# Patient Record
Sex: Female | Born: 1960
Health system: Southern US, Community
[De-identification: ages and names within clinical notes are randomized; demographics above are authoritative.]

## PROBLEM LIST (undated history)

## (undated) ENCOUNTER — Ambulatory Visit

## (undated) DIAGNOSIS — F439 Reaction to severe stress, unspecified: Secondary | ICD-10-CM

## (undated) DIAGNOSIS — R300 Dysuria: Secondary | ICD-10-CM

## (undated) DIAGNOSIS — R928 Other abnormal and inconclusive findings on diagnostic imaging of breast: Secondary | ICD-10-CM

## (undated) DIAGNOSIS — N390 Urinary tract infection, site not specified: Secondary | ICD-10-CM

## (undated) DIAGNOSIS — K5900 Constipation, unspecified: Secondary | ICD-10-CM

## (undated) DIAGNOSIS — D649 Anemia, unspecified: Secondary | ICD-10-CM

## (undated) HISTORY — DX: Dysuria: R30.0

## (undated) HISTORY — DX: Urinary tract infection, site not specified: N39.0

## (undated) HISTORY — DX: Constipation, unspecified: K59.00

## (undated) HISTORY — DX: Reaction to severe stress, unspecified: F43.9

## (undated) HISTORY — DX: Anemia, unspecified: D64.9

## (undated) HISTORY — PX: NO PAST SURGERIES: SHX2092

---
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
## 2002-02-03 ENCOUNTER — Ambulatory Visit (HOSPITAL_COMMUNITY): Admission: RE | Admit: 2002-02-03 | Discharge: 2002-02-03 | Payer: Self-pay | Admitting: Family Medicine

## 2002-02-03 ENCOUNTER — Encounter: Payer: Self-pay | Admitting: Family Medicine

## 2003-06-13 ENCOUNTER — Ambulatory Visit (HOSPITAL_COMMUNITY): Admission: RE | Admit: 2003-06-13 | Discharge: 2003-06-13 | Payer: Self-pay | Admitting: *Deleted

## 2004-06-14 ENCOUNTER — Ambulatory Visit (HOSPITAL_COMMUNITY): Admission: RE | Admit: 2004-06-14 | Discharge: 2004-06-14 | Payer: Self-pay | Admitting: *Deleted

## 2005-06-16 ENCOUNTER — Ambulatory Visit (HOSPITAL_COMMUNITY): Admission: RE | Admit: 2005-06-16 | Discharge: 2005-06-16 | Payer: Self-pay | Admitting: *Deleted

## 2006-06-19 ENCOUNTER — Ambulatory Visit (HOSPITAL_COMMUNITY): Admission: RE | Admit: 2006-06-19 | Discharge: 2006-06-19 | Payer: Self-pay | Admitting: Obstetrics and Gynecology

## 2007-03-01 ENCOUNTER — Other Ambulatory Visit: Admission: RE | Admit: 2007-03-01 | Discharge: 2007-03-01 | Payer: Self-pay | Admitting: Obstetrics and Gynecology

## 2007-06-22 ENCOUNTER — Ambulatory Visit (HOSPITAL_COMMUNITY): Admission: RE | Admit: 2007-06-22 | Discharge: 2007-06-22 | Payer: Self-pay | Admitting: Obstetrics and Gynecology

## 2008-03-03 ENCOUNTER — Other Ambulatory Visit: Admission: RE | Admit: 2008-03-03 | Discharge: 2008-03-03 | Payer: Self-pay | Admitting: Obstetrics and Gynecology

## 2008-06-27 ENCOUNTER — Ambulatory Visit (HOSPITAL_COMMUNITY): Admission: RE | Admit: 2008-06-27 | Discharge: 2008-06-27 | Payer: Self-pay | Admitting: Obstetrics & Gynecology

## 2009-03-27 ENCOUNTER — Other Ambulatory Visit: Admission: RE | Admit: 2009-03-27 | Discharge: 2009-03-27 | Payer: Self-pay | Admitting: Obstetrics and Gynecology

## 2009-06-28 ENCOUNTER — Ambulatory Visit (HOSPITAL_COMMUNITY): Admission: RE | Admit: 2009-06-28 | Discharge: 2009-06-28 | Payer: Self-pay | Admitting: Obstetrics & Gynecology

## 2010-05-12 ENCOUNTER — Encounter: Payer: Self-pay | Admitting: Obstetrics and Gynecology

## 2010-05-24 ENCOUNTER — Other Ambulatory Visit: Payer: Self-pay | Admitting: Obstetrics & Gynecology

## 2010-05-24 DIAGNOSIS — Z139 Encounter for screening, unspecified: Secondary | ICD-10-CM

## 2010-07-01 ENCOUNTER — Ambulatory Visit (HOSPITAL_COMMUNITY)
Admission: RE | Admit: 2010-07-01 | Discharge: 2010-07-01 | Disposition: A | Discharge status: Home / Self Care | Payer: BC Managed Care – PPO | Source: Ambulatory Visit | Attending: Obstetrics & Gynecology | Admitting: Obstetrics & Gynecology

## 2010-07-01 DIAGNOSIS — Z139 Encounter for screening, unspecified: Secondary | ICD-10-CM

## 2010-07-01 DIAGNOSIS — Z1231 Encounter for screening mammogram for malignant neoplasm of breast: Secondary | ICD-10-CM | POA: Insufficient documentation

## 2011-03-05 ENCOUNTER — Encounter: Payer: Self-pay | Admitting: Internal Medicine

## 2011-03-18 ENCOUNTER — Ambulatory Visit (AMBULATORY_SURGERY_CENTER): Payer: BC Managed Care – PPO

## 2011-03-18 ENCOUNTER — Encounter: Payer: Self-pay | Admitting: Internal Medicine

## 2011-03-18 VITALS — Ht 64.0 in | Wt 173.6 lb

## 2011-03-18 DIAGNOSIS — Z1211 Encounter for screening for malignant neoplasm of colon: Secondary | ICD-10-CM

## 2011-03-18 MED ORDER — PEG-KCL-NACL-NASULF-NA ASC-C 100 G PO SOLR: 1.0000 | Freq: Once | ORAL | Status: AC

## 2011-04-01 ENCOUNTER — Ambulatory Visit (AMBULATORY_SURGERY_CENTER): Payer: BC Managed Care – PPO | Admitting: Internal Medicine

## 2011-04-01 ENCOUNTER — Encounter: Payer: Self-pay | Admitting: Internal Medicine

## 2011-04-01 VITALS — BP 151/96 | HR 101 | Temp 97.8°F | Resp 20 | Ht 64.0 in | Wt 173.0 lb

## 2011-04-01 DIAGNOSIS — D126 Benign neoplasm of colon, unspecified: Secondary | ICD-10-CM

## 2011-04-01 DIAGNOSIS — Z1211 Encounter for screening for malignant neoplasm of colon: Secondary | ICD-10-CM

## 2011-04-01 HISTORY — PX: COLONOSCOPY: SHX174

## 2011-04-01 MED ORDER — SODIUM CHLORIDE 0.9 % IV SOLN: 500.0000 mL | INTRAVENOUS | Status: DC

## 2011-04-01 NOTE — Progress Notes (Signed)
Patient did not experience any of the following events: a burn prior to discharge; a fall within the facility; wrong site/side/patient/procedure/implant event; or a hospital transfer or hospital admission upon discharge from the facility. (G8907) Patient did not have preoperative order for IV antibiotic SSI prophylaxis. ([id number])  

## 2011-04-01 NOTE — Patient Instructions (Signed)
Please refer to your blue and neon green sheets for instructions regarding diet and activity for the rest of today.  You may resume your medications as you would normally take them.   Hemorrhoids Hemorrhoids are enlarged (dilated) veins around the rectum. There are 2 types of hemorrhoids, and the type of hemorrhoid is determined by its location. Internal hemorrhoids occur in the veins just inside the rectum.They are usually not painful, but they may bleed.However, they may poke through to the outside and become irritated and painful. External hemorrhoids involve the veins outside the anus and can be felt as a painful swelling or hard lump near the anus.They are often itchy and may crack and bleed. Sometimes clots will form in the veins. This makes them swollen and painful. These are called thrombosed hemorrhoids. CAUSES Causes of hemorrhoids include:  Pregnancy. This increases the pressure in the hemorrhoidal veins.   Constipation.   Straining to have a bowel movement.   Obesity.   Heavy lifting or other activity that caused you to strain.  TREATMENT Most of the time hemorrhoids improve in 1 to 2 weeks. However, if symptoms do not seem to be getting better or if you have a lot of rectal bleeding, your caregiver may perform a procedure to help make the hemorrhoids get smaller or remove them completely.Possible treatments include:  Rubber band ligation. A rubber band is placed at the base of the hemorrhoid to cut off the circulation.   Sclerotherapy. A chemical is injected to shrink the hemorrhoid.   Infrared light therapy. Tools are used to burn the hemorrhoid.   Hemorrhoidectomy. This is surgical removal of the hemorrhoid.  HOME CARE INSTRUCTIONS   Increase fiber in your diet. Ask your caregiver about using fiber supplements.   Drink enough water and fluids to keep your urine clear or pale yellow.   Exercise regularly.   Go to the bathroom when you have the urge to have a  bowel movement. Do not wait.   Avoid straining to have bowel movements.   Keep the anal area dry and clean.   Only take over-the-counter or prescription medicines for pain, discomfort, or fever as directed by your caregiver.  If your hemorrhoids are thrombosed:  Take warm sitz baths for 20 to 30 minutes, 3 to 4 times per day.   If the hemorrhoids are very tender and swollen, place ice packs on the area as tolerated. Using ice packs between sitz baths may be helpful. Fill a plastic bag with ice. Place a towel between the bag of ice and your skin.   Medicated creams and suppositories may be used or applied as directed.   Do not use a donut-shaped pillow or sit on the toilet for long periods. This increases blood pooling and pain.  SEEK MEDICAL CARE IF:   You have increasing pain and swelling that is not controlled with your medicine.   You have uncontrolled bleeding.   You have difficulty or you are unable to have a bowel movement.   You have pain or inflammation outside the area of the hemorrhoids.   You have chills or an oral temperature above 102 F (38.9 C).  MAKE SURE YOU:   Understand these instructions.   Will watch your condition.   Will get help right away if you are not doing well or get worse.  Document Released: 04/04/2000 Document Revised: 12/18/2010 Document Reviewed: 08/10/2007 [facility] Patient Information 2012 [place], [place].  Diverticulosis Diverticulosis is a common condition that develops when small  pouches (diverticula) form in the wall of the colon. The risk of diverticulosis increases with age. It happens more often in people who eat a low-fiber diet. Most individuals with diverticulosis have no symptoms. Those individuals with symptoms usually experience abdominal pain, constipation, or loose stools (diarrhea). HOME CARE INSTRUCTIONS   Increase the amount of fiber in your diet as directed by your caregiver or dietician. This may reduce symptoms of  diverticulosis.   Your caregiver may recommend taking a dietary fiber supplement.   Drink at least 6 to 8 glasses of water each day to prevent constipation.   Try not to strain when you have a bowel movement.   Your caregiver may recommend avoiding nuts and seeds to prevent complications, although this is still an uncertain benefit.   Only take over-the-counter or prescription medicines for pain, discomfort, or fever as directed by your caregiver.  FOODS WITH HIGH FIBER CONTENT INCLUDE:  Fruits. Apple, peach, pear, tangerine, raisins, prunes.   Vegetables. Brussels sprouts, asparagus, broccoli, cabbage, carrot, cauliflower, romaine lettuce, spinach, summer squash, tomato, winter squash, zucchini.   Starchy Vegetables. Baked beans, kidney beans, lima beans, split peas, lentils, potatoes (with skin).   Grains. Whole wheat bread, brown rice, bran flake cereal, plain oatmeal, white rice, shredded wheat, bran muffins.  SEEK IMMEDIATE MEDICAL CARE IF:   You develop increasing pain or severe bloating.   You have an oral temperature above 102 F (38.9 C), not controlled by medicine.   You develop vomiting or bowel movements that are bloody or black.  Document Released: 01/03/2004 Document Revised: 12/18/2010 Document Reviewed: 09/05/2009 [facility] Patient Information 2012 [place], [place].

## 2011-04-01 NOTE — Op Note (Signed)
[facility] [address] [place], [place]  [address]  COLONOSCOPY PROCEDURE REPORT  PATIENT:  Sarah, May  MR#:  [id number] BIRTHDATE:  1961-01-28, 50 yrs. old  GENDER:  female ENDOSCOPIST:  Hedwig Morton. Juanda Chance, MD REF. BY:  Artis Delay, M.D. PROCEDURE DATE:  04/01/2011 PROCEDURE:  Colonoscopy with snare polypectomy ASA CLASS:  Class II INDICATIONS:  colorectal cancer screening, average risk MEDICATIONS:   These medications were titrated to patient response per physician's verbal order, Versed 10 mg, Fentanyl 100 mcg, Benadryl 25 mg  DESCRIPTION OF PROCEDURE:   After the risks and benefits and of the procedure were explained, informed consent was obtained. Digital rectal exam was performed and revealed no rectal masses. The LB [id number] endoscope was introduced through the anus and advanced to the cecum, which was identified by both the appendix and ileocecal valve.  The quality of the prep was good, using MoviPrep.  The instrument was then slowly withdrawn as the colon was fully examined. <<PROCEDUREIMAGES>>  FINDINGS:  Moderate diverticulosis was found in the sigmoid colon (see image1). narrow lumen, deep diverticuli  A sessile polyp was found. at 20 cm polyp vs musocal tear from pushing the scope through narrow sigmoid colon, Polyp was snared without cautery. Retrieval was unsuccessful (see image5). snare polyp  This was otherwise a normal examination of the colon (see image1, image2, image3, and image4).  Internal Hemorrhoids were found (see image7 and image8).   Retroflexed views in the rectum revealed no abnormalities.    The scope was then withdrawn from the patient and the procedure completed.  COMPLICATIONS:  None ENDOSCOPIC IMPRESSION: 1) Moderate diverticulosis in the sigmoid colon 2) Sessile polyp 3) Otherwise normal examination 4) Internal hemorrhoids RECOMMENDATIONS: 1) High fiber diet.  REPEAT EXAM:  In 10 year(s) for.  metamucil 1 Tsp  daily  ______________________________ Hedwig Morton. Juanda Chance, MD  CC:  n. eSIGNED:   Hedwig Morton. Tahira Olivarez at 04/01/2011 02:42 PM  Debbra Riding, [phone]

## 2011-04-02 ENCOUNTER — Telehealth: Payer: Self-pay | Admitting: *Deleted

## 2011-04-02 NOTE — Telephone Encounter (Signed)
No answer, message left

## 2011-04-04 ENCOUNTER — Other Ambulatory Visit: Payer: Self-pay | Admitting: Obstetrics and Gynecology

## 2011-04-04 ENCOUNTER — Other Ambulatory Visit (HOSPITAL_COMMUNITY)
Admission: RE | Admit: 2011-04-04 | Discharge: 2011-04-04 | Disposition: A | Discharge status: Home / Self Care | Payer: BC Managed Care – PPO | Source: Ambulatory Visit | Attending: Obstetrics and Gynecology | Admitting: Obstetrics and Gynecology

## 2011-04-04 DIAGNOSIS — Z01419 Encounter for gynecological examination (general) (routine) without abnormal findings: Secondary | ICD-10-CM | POA: Insufficient documentation

## 2011-05-06 ENCOUNTER — Other Ambulatory Visit (HOSPITAL_COMMUNITY): Payer: Self-pay | Admitting: Family Medicine

## 2011-05-06 DIAGNOSIS — Z139 Encounter for screening, unspecified: Secondary | ICD-10-CM

## 2011-07-04 ENCOUNTER — Ambulatory Visit (HOSPITAL_COMMUNITY)
Admission: RE | Admit: 2011-07-04 | Discharge: 2011-07-04 | Disposition: A | Discharge status: Home / Self Care | Payer: BC Managed Care – PPO | Source: Ambulatory Visit | Attending: Family Medicine | Admitting: Family Medicine

## 2011-07-04 DIAGNOSIS — Z1231 Encounter for screening mammogram for malignant neoplasm of breast: Secondary | ICD-10-CM | POA: Insufficient documentation

## 2011-07-04 DIAGNOSIS — Z139 Encounter for screening, unspecified: Secondary | ICD-10-CM

## 2012-05-13 ENCOUNTER — Other Ambulatory Visit (HOSPITAL_COMMUNITY)
Admission: RE | Admit: 2012-05-13 | Discharge: 2012-05-13 | Disposition: A | Discharge status: Home / Self Care | Payer: BC Managed Care – PPO | Source: Ambulatory Visit | Attending: Obstetrics and Gynecology | Admitting: Obstetrics and Gynecology

## 2012-05-13 ENCOUNTER — Other Ambulatory Visit: Payer: Self-pay | Admitting: Adult Health

## 2012-05-13 DIAGNOSIS — Z1151 Encounter for screening for human papillomavirus (HPV): Secondary | ICD-10-CM | POA: Insufficient documentation

## 2012-05-13 DIAGNOSIS — Z01419 Encounter for gynecological examination (general) (routine) without abnormal findings: Secondary | ICD-10-CM | POA: Insufficient documentation

## 2012-06-11 ENCOUNTER — Other Ambulatory Visit (HOSPITAL_COMMUNITY): Payer: Self-pay | Admitting: Family Medicine

## 2012-06-11 DIAGNOSIS — Z139 Encounter for screening, unspecified: Secondary | ICD-10-CM

## 2012-07-05 ENCOUNTER — Ambulatory Visit (HOSPITAL_COMMUNITY)
Admission: RE | Admit: 2012-07-05 | Discharge: 2012-07-05 | Disposition: A | Discharge status: Home / Self Care | Payer: BC Managed Care – PPO | Source: Ambulatory Visit | Attending: Family Medicine | Admitting: Family Medicine

## 2012-07-05 DIAGNOSIS — Z1231 Encounter for screening mammogram for malignant neoplasm of breast: Secondary | ICD-10-CM | POA: Insufficient documentation

## 2012-07-05 DIAGNOSIS — Z139 Encounter for screening, unspecified: Secondary | ICD-10-CM

## 2013-06-06 ENCOUNTER — Other Ambulatory Visit (HOSPITAL_COMMUNITY): Payer: Self-pay | Admitting: Family Medicine

## 2013-06-06 DIAGNOSIS — Z1231 Encounter for screening mammogram for malignant neoplasm of breast: Secondary | ICD-10-CM

## 2013-07-15 ENCOUNTER — Ambulatory Visit (HOSPITAL_COMMUNITY)
Admission: RE | Admit: 2013-07-15 | Discharge: 2013-07-15 | Disposition: A | Discharge status: Home / Self Care | Payer: BC Managed Care – PPO | Source: Ambulatory Visit | Attending: Family Medicine | Admitting: Family Medicine

## 2013-07-15 DIAGNOSIS — Z1231 Encounter for screening mammogram for malignant neoplasm of breast: Secondary | ICD-10-CM | POA: Insufficient documentation

## 2014-05-08 ENCOUNTER — Ambulatory Visit (INDEPENDENT_AMBULATORY_CARE_PROVIDER_SITE_OTHER): Payer: BLUE CROSS/BLUE SHIELD | Admitting: Adult Health

## 2014-05-08 ENCOUNTER — Encounter: Payer: Self-pay | Admitting: Adult Health

## 2014-05-08 VITALS — BP 130/68 | HR 74 | Ht 63.25 in | Wt 158.0 lb

## 2014-05-08 DIAGNOSIS — F439 Reaction to severe stress, unspecified: Secondary | ICD-10-CM

## 2014-05-08 DIAGNOSIS — K5900 Constipation, unspecified: Secondary | ICD-10-CM

## 2014-05-08 DIAGNOSIS — Z1212 Encounter for screening for malignant neoplasm of rectum: Secondary | ICD-10-CM

## 2014-05-08 DIAGNOSIS — Z01419 Encounter for gynecological examination (general) (routine) without abnormal findings: Secondary | ICD-10-CM

## 2014-05-08 HISTORY — DX: Constipation, unspecified: K59.00

## 2014-05-08 HISTORY — DX: Reaction to severe stress, unspecified: F43.9

## 2014-05-08 LAB — HEMOCCULT GUIAC POC 1CARD (OFFICE): Fecal Occult Blood, POC: NEGATIVE

## 2014-05-08 MED ORDER — SERTRALINE HCL 25 MG PO TABS: 25.0000 mg | ORAL_TABLET | Freq: Every day | ORAL | Status: DC

## 2014-05-08 NOTE — Progress Notes (Signed)
Patient ID: Sarah May, female   DOB: 04-Jan-1961, 54 y.o.   MRN: [id number] History of Present Illness: Sarah May is a 54 year old white female in for gyn exam.She had a normal pap with negative HPV 05/13/12.She is complaining of constipation, has used exlax, and is stressed at home and work.Her Mom has dementia and brother as Down's.Had colonoscopy 03/2011.   Current Medications, Allergies, Past Medical History, Past Surgical History, Family History and Social History were reviewed in [employer] record.     Review of Systems: Patient denies any headaches, blurred vision, shortness of breath, chest pain, abdominal pain, problems with  urination, or intercourse. Not having sex, no joint pain or mood swings,see HPI for positives.    Physical Exam:BP 130/68 mmHg  Pulse 74  Ht 5' 3.25" (1.607 m)  Wt 158 lb (71.668 kg)  BMI 27.75 kg/m2  LMP 06/10/2011 General:  Well developed, well nourished, no acute distress Skin:  Warm and dry Neck:  Midline trachea, normal thyroid Lungs; Clear to auscultation bilaterally Breast:  No dominant palpable mass, retraction, or nipple discharge Cardiovascular: Regular rate and rhythm Abdomen:  Soft, non tender, no hepatosplenomegaly Pelvic:  External genitalia is normal in appearance, no lesions.  The vagina has loss of color, moisture and rugae.  The cervix is smooth.  Uterus is felt to be normal size, shape, and contour.  No                adnexal masses or tenderness noted. Rectal: Good sphincter tone, no polyps,internal hemorrhoids felt.  Hemoccult negative. Extremities:  No swelling or varicosities noted Psych:  No mood changes,alert and cooperative,seems tired, discussed taking time for self and trying Zoloft, declines counseling at this time   Impression: Well woman gyn exam  Constipation  Stress     Plan: Try miralax Rx Zoloft 25 mg #30 1 daily with 3 refills Follow up in 3 months Pap and physical in 1 year Mammogram  yearly Labs at work Review handouts on stress and constipation

## 2014-05-08 NOTE — Patient Instructions (Signed)
Constipation  Constipation is when a person has fewer than three bowel movements a week, has difficulty having a bowel movement, or has stools that are dry, hard, or larger than normal. As people grow older, constipation is more common. If you try to fix constipation with medicines that make you have a bowel movement (laxatives), the problem may get worse. Long-term laxative use may cause the muscles of the colon to become weak. A low-fiber diet, not taking in enough fluids, and taking certain medicines may make constipation worse.   CAUSES   · Certain medicines, such as antidepressants, pain medicine, iron supplements, antacids, and water pills.    · Certain diseases, such as diabetes, irritable bowel syndrome (IBS), thyroid disease, or depression.    · Not drinking enough water.    · Not eating enough fiber-rich foods.    · Stress or travel.    · Lack of physical activity or exercise.    · Ignoring the urge to have a bowel movement.    · Using laxatives too much.    SIGNS AND SYMPTOMS   · Having fewer than three bowel movements a week.    · Straining to have a bowel movement.    · Having stools that are hard, dry, or larger than normal.    · Feeling full or bloated.    · Pain in the lower abdomen.    · Not feeling relief after having a bowel movement.    DIAGNOSIS   Your health care provider will take a medical history and perform a physical exam. Further testing may be done for severe constipation. Some tests may include:  · A barium enema X-ray to examine your rectum, colon, and, sometimes, your small intestine.    · A sigmoidoscopy to examine your lower colon.    · A colonoscopy to examine your entire colon.  TREATMENT   Treatment will depend on the severity of your constipation and what is causing it. Some dietary treatments include drinking more fluids and eating more fiber-rich foods. Lifestyle treatments may include regular exercise. If these diet and lifestyle recommendations do not help, your health care  provider may recommend taking over-the-counter laxative medicines to help you have bowel movements. Prescription medicines may be prescribed if over-the-counter medicines do not work.   HOME CARE INSTRUCTIONS   · Eat foods that have a lot of fiber, such as fruits, vegetables, whole grains, and beans.  · Limit foods high in fat and processed sugars, such as french fries, hamburgers, cookies, candies, and soda.    · A fiber supplement may be added to your diet if you cannot get enough fiber from foods.    · Drink enough fluids to keep your urine clear or pale yellow.    · Exercise regularly or as directed by your health care provider.    · Go to the restroom when you have the urge to go. Do not hold it.    · Only take over-the-counter or prescription medicines as directed by your health care provider. Do not take other medicines for constipation without talking to your health care provider first.    SEEK IMMEDIATE MEDICAL CARE IF:   · You have bright red blood in your stool.    · Your constipation lasts for more than 4 days or gets worse.    · You have abdominal or rectal pain.    · You have thin, pencil-like stools.    · You have unexplained weight loss.  MAKE SURE YOU:   · Understand these instructions.  · Will watch your condition.  · Will get help right away if you are not   you have with your health care provider. Stress Stress-related medical problems are becoming increasingly common. The body has a built-in physical response to stressful situations. Faced with pressure, challenge or danger, we need to react quickly. Our bodies release hormones such as cortisol and adrenaline to help do this. These hormones are part of  the "fight or flight" response and affect the metabolic rate, heart rate and blood pressure, resulting in a heightened, stressed state that prepares the body for optimum performance in dealing with a stressful situation. It is likely that early man required these mechanisms to stay alive, but usually modern stresses do not call for this, and the same hormones released in today's world can damage health and reduce coping ability. CAUSES  Pressure to perform at work, at school or in sports.  Threats of physical violence.  Money worries.  Arguments.  Family conflicts.  Divorce or separation from significant other.  Bereavement.  New job or unemployment.  Changes in location.  Alcohol or drug abuse. SOMETIMES, THERE IS NO PARTICULAR REASON FOR DEVELOPING STRESS. Almost all people are at risk of being stressed at some time in their lives. It is important to know that some stress is temporary and some is long term.  Temporary stress will go away when a situation is resolved. Most people can cope with short periods of stress, and it can often be relieved by relaxing, taking a walk or getting any type of exercise, chatting through issues with friends, or having a good night's sleep.  Chronic (long-term, continuous) stress is much harder to deal with. It can be psychologically and emotionally damaging. It can be harmful both for an individual and for friends and family. SYMPTOMS Everyone reacts to stress differently. There are some common effects that help [place] recognize it. In times of extreme stress, people may:  Shake uncontrollably.  Breathe faster and deeper than normal (hyperventilate).  Vomit.  For people with asthma, stress can trigger an attack.  For some people, stress may trigger migraine headaches, ulcers, and body pain. PHYSICAL EFFECTS OF STRESS MAY INCLUDE:  Loss of energy.  Skin problems.  Aches and pains resulting from tense muscles, including neck ache, backache  and tension headaches.  Increased pain from arthritis and other conditions.  Irregular heart beat (palpitations).  Periods of irritability or anger.  Apathy or depression.  Anxiety (feeling uptight or worrying).  Unusual behavior.  Loss of appetite.  Comfort eating.  Lack of concentration.  Loss of, or decreased, sex-drive.  Increased smoking, drinking, or recreational drug use.  For women, missed periods.  Ulcers, joint pain, and muscle pain. Post-traumatic stress is the stress caused by any serious accident, strong emotional damage, or extremely difficult or violent experience such as rape or war. Post-traumatic stress victims can experience mixtures of emotions such as fear, shame, depression, guilt or anger. It may include recurrent memories or images that may be haunting. These feelings can last for weeks, months or even years after the traumatic event that triggered them. Specialized treatment, possibly with medicines and psychological therapies, is available. If stress is causing physical symptoms, severe distress or making it difficult for you to function as normal, it is worth seeing your caregiver. It is important to remember that although stress is a usual part of life, extreme or prolonged stress can lead to other illnesses that will need treatment. It is better to visit a doctor sooner rather than later. Stress has been linked to the development of high blood pressure and  heart disease, as well as insomnia and depression. There is no diagnostic test for stress since everyone reacts to it differently. But a caregiver will be able to spot the physical symptoms, such as:  Headaches.  Shingles.  Ulcers. Emotional distress such as intense worry, low mood or irritability should be detected when the doctor asks pertinent questions to identify any underlying problems that might be the cause. In case there are physical reasons for the symptoms, the doctor may also want to do  some tests to exclude certain conditions. If you feel that you are suffering from stress, try to identify the aspects of your life that are causing it. Sometimes you may not be able to change or avoid them, but even a small change can have a positive ripple effect. A simple lifestyle change can make all the difference. STRATEGIES THAT CAN HELP DEAL WITH STRESS:  Delegating or sharing responsibilities.  Avoiding confrontations.  Learning to be more assertive.  Regular exercise.  Avoid using alcohol or street drugs to cope.  Eating a healthy, balanced diet, rich in fruit and vegetables and proteins.  Finding humor or absurdity in stressful situations.  Never taking on more than you know you can handle comfortably.  Organizing your time better to get as much done as possible.  Talking to friends or family and sharing your thoughts and fears.  Listening to music or relaxation tapes.  Relaxation techniques like deep breathing, meditation, and yoga.  Tensing and then relaxing your muscles, starting at the toes and working up to the head and neck. If you think that you would benefit from help, either in identifying the things that are causing your stress or in learning techniques to help you relax, see a caregiver who is capable of helping you with this. Rather than relying on medications, it is usually better to try and identify the things in your life that are causing stress and try to deal with them. There are many techniques of managing stress including counseling, psychotherapy, aromatherapy, yoga, and exercise. Your caregiver can help you determine what is best for you. Document Released: 06/28/2002 Document Revised: 04/12/2013 Document Reviewed: 05/25/2007 [facility] Patient Information 2015 [place], [place]. This information is not intended to replace advice given to you by your health care provider. Make sure you discuss any questions you have with your health care provider. Take time  for self Try miralax Take zoloft 25 mg 1 daily Follow up in 3 months Pap and physical in 1 year  Mammogram yearly

## 2014-06-07 ENCOUNTER — Telehealth: Payer: Self-pay | Admitting: *Deleted

## 2014-06-07 NOTE — Telephone Encounter (Signed)
Noted that stopped zoloft

## 2014-06-12 ENCOUNTER — Other Ambulatory Visit (HOSPITAL_COMMUNITY): Payer: Self-pay | Admitting: Family Medicine

## 2014-06-12 DIAGNOSIS — Z1231 Encounter for screening mammogram for malignant neoplasm of breast: Secondary | ICD-10-CM

## 2014-07-17 ENCOUNTER — Ambulatory Visit (HOSPITAL_COMMUNITY)
Admission: RE | Admit: 2014-07-17 | Discharge: 2014-07-17 | Disposition: A | Discharge status: Home / Self Care | Payer: BLUE CROSS/BLUE SHIELD | Source: Ambulatory Visit | Attending: Family Medicine | Admitting: Family Medicine

## 2014-07-17 DIAGNOSIS — Z1231 Encounter for screening mammogram for malignant neoplasm of breast: Secondary | ICD-10-CM | POA: Insufficient documentation

## 2014-08-09 ENCOUNTER — Ambulatory Visit: Payer: BLUE CROSS/BLUE SHIELD | Admitting: Adult Health

## 2015-04-01 ENCOUNTER — Other Ambulatory Visit: Payer: Self-pay | Admitting: Adult Health

## 2015-05-10 ENCOUNTER — Other Ambulatory Visit (HOSPITAL_COMMUNITY)
Admission: RE | Admit: 2015-05-10 | Discharge: 2015-05-10 | Disposition: A | Discharge status: Home / Self Care | Payer: BLUE CROSS/BLUE SHIELD | Source: Ambulatory Visit | Attending: Adult Health | Admitting: Adult Health

## 2015-05-10 ENCOUNTER — Ambulatory Visit (INDEPENDENT_AMBULATORY_CARE_PROVIDER_SITE_OTHER): Payer: BLUE CROSS/BLUE SHIELD | Admitting: Adult Health

## 2015-05-10 ENCOUNTER — Encounter: Payer: Self-pay | Admitting: Adult Health

## 2015-05-10 VITALS — BP 130/80 | HR 66 | Ht 62.5 in | Wt 162.0 lb

## 2015-05-10 DIAGNOSIS — F439 Reaction to severe stress, unspecified: Secondary | ICD-10-CM

## 2015-05-10 DIAGNOSIS — N390 Urinary tract infection, site not specified: Secondary | ICD-10-CM

## 2015-05-10 DIAGNOSIS — Z01419 Encounter for gynecological examination (general) (routine) without abnormal findings: Secondary | ICD-10-CM | POA: Diagnosis not present

## 2015-05-10 DIAGNOSIS — R300 Dysuria: Secondary | ICD-10-CM

## 2015-05-10 DIAGNOSIS — Z1212 Encounter for screening for malignant neoplasm of rectum: Secondary | ICD-10-CM

## 2015-05-10 DIAGNOSIS — Z1151 Encounter for screening for human papillomavirus (HPV): Secondary | ICD-10-CM | POA: Diagnosis not present

## 2015-05-10 HISTORY — DX: Dysuria: R30.0

## 2015-05-10 HISTORY — DX: Urinary tract infection, site not specified: N39.0

## 2015-05-10 LAB — HEMOCCULT GUIAC POC 1CARD (OFFICE): Fecal Occult Blood, POC: NEGATIVE

## 2015-05-10 LAB — POCT URINALYSIS DIPSTICK
Glucose, UA: NEGATIVE
Ketones, UA: NEGATIVE
Leukocytes, UA: NEGATIVE
Nitrite, UA: POSITIVE
Protein, UA: NEGATIVE

## 2015-05-10 MED ORDER — SERTRALINE HCL 25 MG PO TABS: ORAL_TABLET | ORAL | Status: DC

## 2015-05-10 MED ORDER — SULFAMETHOXAZOLE-TRIMETHOPRIM 800-160 MG PO TABS: 1.0000 | ORAL_TABLET | Freq: Two times a day (BID) | ORAL | Status: DC

## 2015-05-10 NOTE — Patient Instructions (Signed)
Physical in 1 year Pap in 3 if normal Mammogram yearly Labs at work  Increase water Urinary Tract Infection Urinary tract infections (UTIs) can develop anywhere along your urinary tract. Your urinary tract is your body's drainage system for removing wastes and extra water. Your urinary tract includes two kidneys, two ureters, a bladder, and a urethra. Your kidneys are a pair of bean-shaped organs. Each kidney is about the size of your fist. They are located below your ribs, one on each side of your spine. CAUSES Infections are caused by microbes, which are microscopic organisms, including fungi, viruses, and bacteria. These organisms are so small that they can only be seen through a microscope. Bacteria are the microbes that most commonly cause UTIs. SYMPTOMS  Symptoms of UTIs may vary by age and gender of the patient and by the location of the infection. Symptoms in young women typically include a frequent and intense urge to urinate and a painful, burning feeling in the bladder or urethra during urination. Older women and men are more likely to be tired, shaky, and weak and have muscle aches and abdominal pain. A fever may mean the infection is in your kidneys. Other symptoms of a kidney infection include pain in your back or sides below the ribs, nausea, and vomiting. DIAGNOSIS To diagnose a UTI, your caregiver will ask you about your symptoms. Your caregiver will also ask you to provide a urine sample. The urine sample will be tested for bacteria and white blood cells. White blood cells are made by your body to help fight infection. TREATMENT  Typically, UTIs can be treated with medication. Because most UTIs are caused by a bacterial infection, they usually can be treated with the use of antibiotics. The choice of antibiotic and length of treatment depend on your symptoms and the type of bacteria causing your infection. HOME CARE INSTRUCTIONS  If you were prescribed antibiotics, take them  exactly as your caregiver instructs you. Finish the medication even if you feel better after you have only taken some of the medication.  Drink enough water and fluids to keep your urine clear or pale yellow.  Avoid caffeine, tea, and carbonated beverages. They tend to irritate your bladder.  Empty your bladder often. Avoid holding urine for long periods of time.  Empty your bladder before and after sexual intercourse.  After a bowel movement, women should cleanse from front to back. Use each tissue only once. SEEK MEDICAL CARE IF:   You have back pain.  You develop a fever.  Your symptoms do not begin to resolve within 3 days. SEEK IMMEDIATE MEDICAL CARE IF:   You have severe back pain or lower abdominal pain.  You develop chills.  You have nausea or vomiting.  You have continued burning or discomfort with urination. MAKE SURE YOU:   Understand these instructions.  Will watch your condition.  Will get help right away if you are not doing well or get worse.   This information is not intended to replace advice given to you by your health care provider. Make sure you discuss any questions you have with your health care provider.   Document Released: 01/15/2005 Document Revised: 12/27/2014 Document Reviewed: 05/16/2011 [employer]vier Interactive Patient Education [employer].

## 2015-05-10 NOTE — Progress Notes (Signed)
Patient ID: Sarah May, female   DOB: 1961-01-18, 55 y.o.   MRN: [id number] History of Present Illness:  Sarah May is a 55 year old white female, single in for well woman gyn exam and pap, complains of burning with urination.She declines the flu shot.She works at [employer] and gets labs at work. PCP is [place].  Current Medications, Allergies, Past Medical History, Past Surgical History, Family History and Social History were reviewed in [employer] record.     Review of Systems: Patient denies any headaches, hearing loss, fatigue, blurred vision, shortness of breath, chest pain, abdominal pain, problems with bowel movements,  or intercourse(not sexually active). No joint pain or mood swings.See HPI for positives.   Physical Exam:BP 130/80 mmHg  Pulse 66  Ht 5' 2.5" (1.588 m)  Wt 162 lb (73.483 kg)  BMI 29.14 kg/m2  LMP 06/10/2011 Urine + nitrates, trace blood General:  Well developed, well nourished, no acute distress Skin:  Warm and dry Neck:  Midline trachea, normal thyroid, good ROM, no lymphadenopathy Lungs; Clear to auscultation bilaterally Breast:  No dominant palpable mass, retraction, or nipple discharge Cardiovascular: Regular rate and rhythm Abdomen:  Soft, non tender, no hepatosplenomegaly Pelvic:  External genitalia is normal in appearance, no lesions.  The vagina has decreased color, moisture and rugae. Urethra has no lesions or masses. The cervix is smooth, pap with HPV performed.  Uterus is felt to be normal size, shape, and contour.  No adnexal masses or tenderness noted.Bladder is non tender, no masses felt. Rectal: Good sphincter tone, no polyps, or hemorrhoids felt.  Hemoccult negative. Extremities/musculoskeletal:  No swelling or varicosities noted, no clubbing or cyanosis Psych:  No mood changes, alert and cooperative,seems happy   Impression: Well woman gyn exam and pap Burning with urination UTI  Stress       Plan: Refilled zoloft 25  mg #30 take 1 daily with 11 refills Rx septra ds #14 take 1 bid x 7 days UA C&S sent Physical in 1 year, pap in 3 if normal Mammogram yearly Colonoscopy per GI  Push water and review handout on UTI

## 2015-05-11 LAB — URINALYSIS, ROUTINE W REFLEX MICROSCOPIC
Bilirubin, UA: NEGATIVE
Glucose, UA: NEGATIVE
Ketones, UA: NEGATIVE
Nitrite, UA: POSITIVE — AB
Protein, UA: NEGATIVE
Specific Gravity, UA: 1.015 (ref 1.005–1.030)
Urobilinogen, Ur: 1 mg/dL (ref 0.2–1.0)
pH, UA: 7 (ref 5.0–7.5)

## 2015-05-11 LAB — MICROSCOPIC EXAMINATION: Casts: NONE SEEN /lpf

## 2015-05-12 LAB — URINE CULTURE

## 2015-05-14 LAB — CYTOLOGY - PAP

## 2015-06-06 ENCOUNTER — Other Ambulatory Visit (HOSPITAL_COMMUNITY): Payer: Self-pay | Admitting: Internal Medicine

## 2015-06-06 DIAGNOSIS — Z1231 Encounter for screening mammogram for malignant neoplasm of breast: Secondary | ICD-10-CM

## 2015-07-20 ENCOUNTER — Ambulatory Visit (HOSPITAL_COMMUNITY)
Admission: RE | Admit: 2015-07-20 | Discharge: 2015-07-20 | Disposition: A | Discharge status: Home / Self Care | Payer: BLUE CROSS/BLUE SHIELD | Source: Ambulatory Visit | Attending: Internal Medicine | Admitting: Internal Medicine

## 2015-07-20 DIAGNOSIS — Z1231 Encounter for screening mammogram for malignant neoplasm of breast: Secondary | ICD-10-CM

## 2016-04-29 ENCOUNTER — Other Ambulatory Visit: Payer: Self-pay | Admitting: Adult Health

## 2016-04-29 DIAGNOSIS — Z1231 Encounter for screening mammogram for malignant neoplasm of breast: Secondary | ICD-10-CM

## 2016-05-26 ENCOUNTER — Other Ambulatory Visit: Payer: Self-pay | Admitting: Adult Health

## 2016-05-26 DIAGNOSIS — Z1389 Encounter for screening for other disorder: Secondary | ICD-10-CM | POA: Diagnosis not present

## 2016-05-26 DIAGNOSIS — Z6827 Body mass index (BMI) 27.0-27.9, adult: Secondary | ICD-10-CM | POA: Diagnosis not present

## 2016-05-26 DIAGNOSIS — R05 Cough: Secondary | ICD-10-CM | POA: Diagnosis not present

## 2016-05-26 DIAGNOSIS — J343 Hypertrophy of nasal turbinates: Secondary | ICD-10-CM | POA: Diagnosis not present

## 2016-06-17 ENCOUNTER — Encounter: Payer: Self-pay | Admitting: Adult Health

## 2016-06-17 ENCOUNTER — Ambulatory Visit (INDEPENDENT_AMBULATORY_CARE_PROVIDER_SITE_OTHER): Payer: BLUE CROSS/BLUE SHIELD | Admitting: Adult Health

## 2016-06-17 VITALS — BP 112/78 | HR 80 | Ht 63.0 in | Wt 160.0 lb

## 2016-06-17 DIAGNOSIS — Z1212 Encounter for screening for malignant neoplasm of rectum: Secondary | ICD-10-CM | POA: Diagnosis not present

## 2016-06-17 DIAGNOSIS — Z01419 Encounter for gynecological examination (general) (routine) without abnormal findings: Secondary | ICD-10-CM

## 2016-06-17 DIAGNOSIS — F439 Reaction to severe stress, unspecified: Secondary | ICD-10-CM

## 2016-06-17 DIAGNOSIS — Z01411 Encounter for gynecological examination (general) (routine) with abnormal findings: Secondary | ICD-10-CM | POA: Diagnosis not present

## 2016-06-17 DIAGNOSIS — Z1211 Encounter for screening for malignant neoplasm of colon: Secondary | ICD-10-CM

## 2016-06-17 LAB — HEMOCCULT GUIAC POC 1CARD (OFFICE): Fecal Occult Blood, POC: NEGATIVE

## 2016-06-17 MED ORDER — SERTRALINE HCL 25 MG PO TABS: ORAL_TABLET | ORAL | 11 refills | Status: DC

## 2016-06-17 NOTE — Patient Instructions (Signed)
Physical in 1 year Pap in 2020 Mammogram yearly Colonoscopy 2022 Labs at work

## 2016-06-17 NOTE — Progress Notes (Signed)
Patient ID: Sarah May, female   DOB: 1960/12/14, 56 y.o.   MRN: [id number] History of Present Illness: Sarah May is a 56 year old white female, single, G0P0, PM in for a well woman gyn exam, she had a normal pap with negative HPV 05/10/15.She had stopped Zoloft, but restarted in December due to stress.  PCP is S.Jackson , PA at [facility].   Current Medications, Allergies, Past Medical History, Past Surgical History, Family History and Social History were reviewed in [employer] record.     Review of Systems:  Patient denies any headaches, hearing loss, fatigue, blurred vision, shortness of breath, chest pain, abdominal pain, problems with bowel movements, urination, or intercourse(not having sex). No joint pain or mood swings. +stress  Physical Exam:BP 112/78 (BP Location: Left Arm, Patient Position: Sitting, Cuff Size: Normal)   Pulse 80   Ht 5\' 3"  (1.6 m)   Wt 160 lb (72.6 kg)   LMP 06/10/2011   BMI 28.34 kg/m  General:  Well developed, well nourished, no acute distress Skin:  Warm and dry Neck:  Midline trachea, normal thyroid, good ROM, no lymphadenopathy Lungs; Clear to auscultation bilaterally Breast:  No dominant palpable mass, retraction, or nipple discharge Cardiovascular: Regular rate and rhythm Abdomen:  Soft, non tender, no hepatosplenomegaly Pelvic:  External genitalia is normal in appearance, no lesions.  The vagina is normal in appearance. Urethra has no lesions or masses. The cervix is smooth.  Uterus is felt to be normal size, shape, and contour.  No adnexal masses or tenderness noted.Bladder is non tender, no masses felt. Rectal: Good sphincter tone, no polyps, + hemorrhoids felt.  Hemoccult negative. Extremities/musculoskeletal:  No swelling or varicosities noted, no clubbing or cyanosis Psych:  No mood changes, alert and cooperative,seems happy PHQ 2 score 0.  Impression: 1. Well woman exam with routine gynecological exam   2. Screening for  colorectal cancer   3. Stress       Plan: Meds ordered this encounter  Medications  . sertraline (ZOLOFT) 25 MG tablet    Sig: TAKE ONE (1) TABLET BY MOUTH EVERY DAY    Dispense:  30 tablet    Refill:  11    Order Specific Question:   Supervising Provider    Answer:   Florian Buff [[id number]]   Physical in 1 year Pap in 2020 Mammogram yearly Colonoscopy 2022 Labs at work

## 2016-07-23 ENCOUNTER — Ambulatory Visit (HOSPITAL_COMMUNITY)
Admission: RE | Admit: 2016-07-23 | Discharge: 2016-07-23 | Disposition: A | Discharge status: Home / Self Care | Payer: BLUE CROSS/BLUE SHIELD | Source: Ambulatory Visit | Attending: Adult Health | Admitting: Adult Health

## 2016-07-23 DIAGNOSIS — Z1231 Encounter for screening mammogram for malignant neoplasm of breast: Secondary | ICD-10-CM | POA: Insufficient documentation

## 2017-06-18 ENCOUNTER — Ambulatory Visit (INDEPENDENT_AMBULATORY_CARE_PROVIDER_SITE_OTHER): Payer: No Typology Code available for payment source | Admitting: Adult Health

## 2017-06-18 ENCOUNTER — Encounter (INDEPENDENT_AMBULATORY_CARE_PROVIDER_SITE_OTHER): Payer: Self-pay

## 2017-06-18 ENCOUNTER — Encounter: Payer: Self-pay | Admitting: Adult Health

## 2017-06-18 VITALS — BP 120/80 | HR 97 | Ht 64.0 in | Wt 168.0 lb

## 2017-06-18 DIAGNOSIS — Z1212 Encounter for screening for malignant neoplasm of rectum: Secondary | ICD-10-CM | POA: Diagnosis not present

## 2017-06-18 DIAGNOSIS — Z01419 Encounter for gynecological examination (general) (routine) without abnormal findings: Secondary | ICD-10-CM | POA: Diagnosis not present

## 2017-06-18 DIAGNOSIS — Z1211 Encounter for screening for malignant neoplasm of colon: Secondary | ICD-10-CM | POA: Diagnosis not present

## 2017-06-18 LAB — HEMOCCULT GUIAC POC 1CARD (OFFICE): Fecal Occult Blood, POC: NEGATIVE

## 2017-06-18 MED ORDER — SERTRALINE HCL 25 MG PO TABS: ORAL_TABLET | ORAL | 11 refills | Status: DC

## 2017-06-18 NOTE — Progress Notes (Signed)
Patient ID: [name], female   DOB: 1960/08/12, 57 y.o.   MRN: [id number] History of Present Illness:  Sarah May is a 57 year old white female, single, PM in for a well woman gyn exam, she had a normal pap with negative HPV 05/10/15.She has retired from the [employer] and is going to work PT at [employer]. Her brother who had Down's died in 03-16-2023.  PCP is Dr Sarah May.  Current Medications, Allergies, Past Medical History, Past Surgical History, Family History and Social History were reviewed in [place] record.     Review of Systems: Patient denies any headaches, hearing loss, fatigue, blurred vision, shortness of breath, chest pain, abdominal pain, problems with bowel movements, urination, or intercourse(not having sex). No joint pain or mood swings.No bleeding since going through menopause.     Physical Exam:BP 120/80 (BP Location: Left Arm, Patient Position: Sitting, Cuff Size: Small)   Pulse 97   Ht 5\' 4"  (1.626 m)   Wt 168 lb (76.2 kg)   LMP 06/10/2011   BMI 28.84 kg/m  General:  Well developed, well nourished, no acute distress Skin:  Warm and dry Neck:  Midline trachea, normal thyroid, good ROM, no lymphadenopathy Lungs; Clear to auscultation bilaterally Breast:  No dominant palpable mass, retraction, or nipple discharge Cardiovascular: Regular rate and rhythm Abdomen:  Soft, non tender, no hepatosplenomegaly Pelvic:  External genitalia is normal in appearance, no lesions.  The vagina is normal in appearance. Urethra has no lesions or masses. The cervix is smooth.  Uterus is felt to be normal size, shape, and contour.  No adnexal masses or tenderness noted.Bladder is non tender, no masses felt. Rectal: Good sphincter tone, no polyps, or hemorrhoids felt.  Hemoccult negative. Extremities/musculoskeletal:  No swelling or varicosities noted, no clubbing or cyanosis Psych:  No mood changes, alert and cooperative,seems happy PHQ 9 score 4, is on Zoloft, denies  any suicidal ideations.If wants to wean off Zoloft to let me know.   Impression:  1. Well woman exam with routine gynecological exam   2. Screening for colorectal cancer      Plan: Pap and physical in 1 year Meds ordered this encounter  Medications  . sertraline (ZOLOFT) 25 MG tablet    Sig: TAKE ONE (1) TABLET BY MOUTH EVERY DAY    Dispense:  30 tablet    Refill:  11    Order Specific Question:   Supervising Provider    Answer:   Florian Buff [[id number]]   Mammogram yearly Colonoscopy per GI Labs in future fasting either with PCP or here

## 2017-06-19 ENCOUNTER — Other Ambulatory Visit (HOSPITAL_COMMUNITY): Payer: Self-pay | Admitting: Internal Medicine

## 2017-06-19 DIAGNOSIS — Z1231 Encounter for screening mammogram for malignant neoplasm of breast: Secondary | ICD-10-CM

## 2017-06-29 ENCOUNTER — Other Ambulatory Visit: Payer: Self-pay | Admitting: Adult Health

## 2017-07-24 ENCOUNTER — Ambulatory Visit (HOSPITAL_COMMUNITY)
Admission: RE | Admit: 2017-07-24 | Discharge: 2017-07-24 | Disposition: A | Discharge status: Home / Self Care | Payer: PRIVATE HEALTH INSURANCE | Source: Ambulatory Visit | Attending: Internal Medicine | Admitting: Internal Medicine

## 2017-07-24 DIAGNOSIS — Z1231 Encounter for screening mammogram for malignant neoplasm of breast: Secondary | ICD-10-CM | POA: Insufficient documentation

## 2018-04-29 DIAGNOSIS — Z6827 Body mass index (BMI) 27.0-27.9, adult: Secondary | ICD-10-CM | POA: Diagnosis not present

## 2018-04-29 DIAGNOSIS — Z23 Encounter for immunization: Secondary | ICD-10-CM | POA: Diagnosis not present

## 2018-04-29 DIAGNOSIS — Z0001 Encounter for general adult medical examination with abnormal findings: Secondary | ICD-10-CM | POA: Diagnosis not present

## 2018-04-29 DIAGNOSIS — H01002 Unspecified blepharitis right lower eyelid: Secondary | ICD-10-CM | POA: Diagnosis not present

## 2018-04-29 DIAGNOSIS — E663 Overweight: Secondary | ICD-10-CM | POA: Diagnosis not present

## 2018-04-29 DIAGNOSIS — Z1389 Encounter for screening for other disorder: Secondary | ICD-10-CM | POA: Diagnosis not present

## 2018-05-03 DIAGNOSIS — Z1389 Encounter for screening for other disorder: Secondary | ICD-10-CM | POA: Diagnosis not present

## 2018-05-10 DIAGNOSIS — D225 Melanocytic nevi of trunk: Secondary | ICD-10-CM | POA: Diagnosis not present

## 2018-05-10 DIAGNOSIS — L308 Other specified dermatitis: Secondary | ICD-10-CM | POA: Diagnosis not present

## 2018-05-10 DIAGNOSIS — C44311 Basal cell carcinoma of skin of nose: Secondary | ICD-10-CM | POA: Diagnosis not present

## 2018-05-10 DIAGNOSIS — Z1283 Encounter for screening for malignant neoplasm of skin: Secondary | ICD-10-CM | POA: Diagnosis not present

## 2018-05-10 DIAGNOSIS — C44319 Basal cell carcinoma of skin of other parts of face: Secondary | ICD-10-CM | POA: Diagnosis not present

## 2018-06-18 ENCOUNTER — Other Ambulatory Visit (HOSPITAL_COMMUNITY): Payer: Self-pay | Admitting: Family Medicine

## 2018-06-18 DIAGNOSIS — Z1231 Encounter for screening mammogram for malignant neoplasm of breast: Secondary | ICD-10-CM

## 2018-06-21 DIAGNOSIS — Z85828 Personal history of other malignant neoplasm of skin: Secondary | ICD-10-CM | POA: Diagnosis not present

## 2018-06-21 DIAGNOSIS — Z08 Encounter for follow-up examination after completed treatment for malignant neoplasm: Secondary | ICD-10-CM | POA: Diagnosis not present

## 2018-07-28 ENCOUNTER — Ambulatory Visit (HOSPITAL_COMMUNITY): Payer: PRIVATE HEALTH INSURANCE

## 2018-08-16 ENCOUNTER — Ambulatory Visit (HOSPITAL_COMMUNITY): Payer: PRIVATE HEALTH INSURANCE

## 2018-09-06 ENCOUNTER — Other Ambulatory Visit (HOSPITAL_COMMUNITY): Payer: Self-pay | Admitting: Family Medicine

## 2018-09-06 ENCOUNTER — Ambulatory Visit (HOSPITAL_COMMUNITY)
Admission: RE | Admit: 2018-09-06 | Discharge: 2018-09-06 | Disposition: A | Discharge status: Home / Self Care | Payer: BLUE CROSS/BLUE SHIELD | Source: Ambulatory Visit | Attending: Family Medicine | Admitting: Family Medicine

## 2018-09-06 ENCOUNTER — Other Ambulatory Visit: Payer: Self-pay

## 2018-09-06 DIAGNOSIS — Z1231 Encounter for screening mammogram for malignant neoplasm of breast: Secondary | ICD-10-CM | POA: Diagnosis not present

## 2018-09-08 ENCOUNTER — Other Ambulatory Visit (HOSPITAL_COMMUNITY): Payer: Self-pay | Admitting: Family Medicine

## 2018-09-08 DIAGNOSIS — R928 Other abnormal and inconclusive findings on diagnostic imaging of breast: Secondary | ICD-10-CM

## 2018-09-14 ENCOUNTER — Other Ambulatory Visit: Payer: Self-pay

## 2018-09-14 ENCOUNTER — Ambulatory Visit (HOSPITAL_COMMUNITY)
Admission: RE | Admit: 2018-09-14 | Discharge: 2018-09-14 | Disposition: A | Discharge status: Home / Self Care | Payer: BLUE CROSS/BLUE SHIELD | Source: Ambulatory Visit | Attending: Family Medicine | Admitting: Family Medicine

## 2018-09-14 ENCOUNTER — Other Ambulatory Visit (HOSPITAL_COMMUNITY): Payer: Self-pay | Admitting: Family Medicine

## 2018-09-14 DIAGNOSIS — R928 Other abnormal and inconclusive findings on diagnostic imaging of breast: Secondary | ICD-10-CM | POA: Diagnosis not present

## 2018-09-14 DIAGNOSIS — N6489 Other specified disorders of breast: Secondary | ICD-10-CM | POA: Diagnosis not present

## 2018-09-24 ENCOUNTER — Other Ambulatory Visit: Payer: Self-pay | Admitting: Family Medicine

## 2018-09-24 DIAGNOSIS — N6489 Other specified disorders of breast: Secondary | ICD-10-CM

## 2018-09-28 ENCOUNTER — Ambulatory Visit
Admission: RE | Admit: 2018-09-28 | Discharge: 2018-09-28 | Disposition: A | Discharge status: Home / Self Care | Payer: BLUE CROSS/BLUE SHIELD | Source: Ambulatory Visit | Attending: Family Medicine | Admitting: Family Medicine

## 2018-09-28 ENCOUNTER — Ambulatory Visit
Admission: RE | Admit: 2018-09-28 | Discharge: 2018-09-28 | Disposition: A | Discharge status: Home / Self Care | Payer: BC Managed Care – PPO | Source: Ambulatory Visit | Attending: Family Medicine | Admitting: Family Medicine

## 2018-09-28 ENCOUNTER — Other Ambulatory Visit: Payer: Self-pay

## 2018-09-28 DIAGNOSIS — N6489 Other specified disorders of breast: Secondary | ICD-10-CM

## 2018-09-28 DIAGNOSIS — R928 Other abnormal and inconclusive findings on diagnostic imaging of breast: Secondary | ICD-10-CM | POA: Diagnosis not present

## 2018-09-28 DIAGNOSIS — N6012 Diffuse cystic mastopathy of left breast: Secondary | ICD-10-CM | POA: Diagnosis not present

## 2018-10-07 ENCOUNTER — Encounter: Payer: Self-pay | Admitting: General Surgery

## 2018-10-11 ENCOUNTER — Other Ambulatory Visit: Payer: Self-pay | Admitting: General Surgery

## 2018-10-11 DIAGNOSIS — R928 Other abnormal and inconclusive findings on diagnostic imaging of breast: Secondary | ICD-10-CM | POA: Diagnosis not present

## 2018-10-13 ENCOUNTER — Other Ambulatory Visit: Payer: Self-pay | Admitting: General Surgery

## 2018-10-13 DIAGNOSIS — R928 Other abnormal and inconclusive findings on diagnostic imaging of breast: Secondary | ICD-10-CM

## 2018-10-24 NOTE — H&P (Signed)
[name] Location: [facility] Patient #: [id number] DOB: 11-Apr-1961 Single / Language: Sarah May / Race: White Female         History of Present Illness The patient is a 58 year old female who presents with a complaint of complex sclerosing lesion left breast 2 areas. This is a healthy 58 year old woman from [place].. Dr. Gerarda Fraction is her PCP. She is referred by Dorise Bullion at the [facility] for complex sclerosing lesion left breast, 2 periareolar areas Livia Snellen was my chaperone throughout the encounter.       She has never had a breast problem. Gets annual mammography. Recent mammogram showed 2 areas of architectural distortion the left breast. 1 retroareolar area and one medial retroareolar area. Ultrasound the axilla is negative. These areas are 1.7 cm apart. Both of these areas were biopsied. On the path report they are labeled upper inner and superior and they both showed complex sclerosing lesion, usual ductal hyperplasia, no atypia she was referred for excision     Past history is negative. She has been healthy baseline family history reveals sister living with a history of breast cancer. Maternal aunt died of breast cancer. Mother living with dementia. Father deceased at heart failure. No ovarian cancer. No pancreatic cancer. A paternal grandmother and a paternal grandfather had some type of cancer but unknown. No family members have had genetic testing according to the patient.  SH - reveals she is single. No children. Lives alone in [place]. Her sister lives nearby. Denies alcohol or tobacco. She is retired used to drop the [place] to work for [employer]      I discussed the nature of her biopsy findings in the histologic problem. I explained that this is a high risk lesion occasionally anywhere from a 5-9% risk of having cancer at this time. Excision is recommended and she completely agrees. I discussed circumareolar incision. I discussed the  placement of 2 radioactive seeds. She will be scheduled for left breast lumpectomy 2 with radioactive seed localization 2. I discussed the indications, details, techniques, numerous risk of the surgery with her. She is aware of the risk of bleeding, infection, reoperation if cancer, cosmetic deformity, chronic pain, partial or complete disruption of the sensation to the nipple. She understands all of these issues. All of her questions are answered. She agrees with this plan.      Once we get the final pathology she should be referred for genetic counseling and testing and high risk clinic, if that seems appropriate      Past Surgical History  No pertinent past surgical history   Diagnostic Studies History  Colonoscopy  5-10 years ago Mammogram  within last year Pap Smear  1-5 years ago  Allergies  No Known Allergies   No Known Drug Allergies Allergies Reconciled   Medication History No Current Medications Medications Reconciled  Social History Caffeine use  Coffee. No alcohol use  No drug use  Tobacco use  Never smoker.  Family History  Breast Cancer  Sister. Heart Disease  Brother, Father, Sister.  Pregnancy / Birth History  Age of menopause  53-60 Gravida  0 Para  0  Other Problems  No pertinent past medical history     Review of Systems  General Not Present- Appetite Loss, Chills, Fatigue, Fever, Night Sweats, Weight Gain and Weight Loss. Skin Not Present- Change in Wart/Mole, Dryness, Hives, Jaundice, New Lesions, Non-Healing Wounds, Rash and Ulcer. HEENT Present- Wears glasses/contact lenses. Not Present- Earache, Hearing Loss,  Hoarseness, Nose Bleed, Oral Ulcers, Ringing in the Ears, Seasonal Allergies, Sinus Pain, Sore Throat, Visual Disturbances and Yellow Eyes. Respiratory Not Present- Bloody sputum, Chronic Cough, Difficulty Breathing, Snoring and Wheezing. Breast Not Present- Breast Mass, Breast Pain, Nipple Discharge and Skin  Changes. Cardiovascular Not Present- Chest Pain, Difficulty Breathing Lying Down, Leg Cramps, Palpitations, Rapid Heart Rate, Shortness of Breath and Swelling of Extremities. Gastrointestinal Not Present- Abdominal Pain, Bloating, Bloody Stool, Change in Bowel Habits, Chronic diarrhea, Constipation, Difficulty Swallowing, Excessive gas, Gets full quickly at meals, Hemorrhoids, Indigestion, Nausea, Rectal Pain and Vomiting. Female Genitourinary Not Present- Frequency, Nocturia, Painful Urination, Pelvic Pain and Urgency. Musculoskeletal Not Present- Back Pain, Joint Pain, Joint Stiffness, Muscle Pain, Muscle Weakness and Swelling of Extremities. Neurological Not Present- Decreased Memory, Fainting, Headaches, Numbness, Seizures, Tingling, Tremor, Trouble walking and Weakness. Psychiatric Not Present- Anxiety, Bipolar, Change in Sleep Pattern, Depression, Fearful and Frequent crying. Endocrine Not Present- Cold Intolerance, Excessive Hunger, Hair Changes, Heat Intolerance, Hot flashes and New Diabetes. Hematology Not Present- Blood Thinners, Easy Bruising, Excessive bleeding, Gland problems, HIV and Persistent Infections.  Vitals  Weight: 164.4 lb Height: 63in Body Surface Area: 1.78 m Body Mass Index: 29.12 kg/m  Temp.: 98.50F (Temporal)  Pulse: 97 (Regular)  BP: 124/68(Sitting, Left Arm, Standard)       Physical Exam General Mental Status-Alert. General Appearance-Consistent with stated age. Hydration-Well hydrated. Voice-Normal. Note: Very alert and appropriate. Admittedly nervous and anxious about her breast problem.   Head and Neck Head-normocephalic, atraumatic with no lesions or palpable masses. Trachea-midline. Thyroid Gland Characteristics - normal size and consistency.  Eye Eyeball - Bilateral-Extraocular movements intact. Sclera/Conjunctiva - Bilateral-No scleral icterus.  Chest and Lung Exam Chest and lung exam reveals -quiet, even  and easy respiratory effort with no use of accessory muscles and on auscultation, normal breath sounds, no adventitious sounds and normal vocal resonance. Inspection Chest Wall - Normal. Back - normal.  Breast Note: Breasts are medium sized. Symmetrical. Skin and nipple and areolar complexes looked normal. 2 biopsy sites left periareolar area. Tiny amount of bruising. Minimal thickening. No dominant breast mass in either breast. No axillary adenopathy.   Cardiovascular Cardiovascular examination reveals -normal heart sounds, regular rate and rhythm with no murmurs and normal pedal pulses bilaterally.  Abdomen Inspection Inspection of the abdomen reveals - No Hernias. Skin - Scar - no surgical scars. Palpation/Percussion Palpation and Percussion of the abdomen reveal - Soft, Non Tender, No Rebound tenderness, No Rigidity (guarding) and No hepatosplenomegaly. Auscultation Auscultation of the abdomen reveals - Bowel sounds normal.  Neurologic Neurologic evaluation reveals -alert and oriented x 3 with no impairment of recent or remote memory. Mental Status-Normal.  Musculoskeletal Normal Exam - Left-Upper Extremity Strength Normal and Lower Extremity Strength Normal. Normal Exam - Right-Upper Extremity Strength Normal and Lower Extremity Strength Normal.  Lymphatic Head & Neck  General Head & Neck Lymphatics: Bilateral - Description - Normal. Axillary  General Axillary Region: Bilateral - Description - Normal. Tenderness - Non Tender. Femoral & Inguinal  Generalized Femoral & Inguinal Lymphatics: Bilateral - Description - Normal. Tenderness - Non Tender.       Assessment & Plan   ABNORMALITY OF LEFT BREAST ON SCREENING MAMMOGRAM (R92.8)   Your recent screening mammograms showed 2 areas of architectural distortion in the left breast, around the nipple and areola Both of these areas were biopsied and showed a condition called complex sclerosing lesion Most likely  this is not cancer However, this is a high risk lesion and  there is a 5-9% chance that there is cancer in your breast at this time For this reason we recommended excision of both of these areas I have discussed this with you in detail and you have agree with this plan I have shown you the likely incision  you will be scheduled for left breast lumpectomy 2 with radioactive seed localization 2 I have discussed the indications, techniques, and risk of the surgery with you in detail  You are going to have your sister drive you home and stay with you one night

## 2018-10-25 ENCOUNTER — Other Ambulatory Visit: Payer: Self-pay

## 2018-10-25 ENCOUNTER — Encounter (HOSPITAL_BASED_OUTPATIENT_CLINIC_OR_DEPARTMENT_OTHER): Payer: Self-pay | Admitting: *Deleted

## 2018-10-26 ENCOUNTER — Encounter (HOSPITAL_BASED_OUTPATIENT_CLINIC_OR_DEPARTMENT_OTHER)
Admission: RE | Admit: 2018-10-26 | Discharge: 2018-10-26 | Disposition: A | Discharge status: Home / Self Care | Payer: BC Managed Care – PPO | Source: Ambulatory Visit | Attending: General Surgery | Admitting: General Surgery

## 2018-10-26 ENCOUNTER — Other Ambulatory Visit (HOSPITAL_COMMUNITY)
Admission: RE | Admit: 2018-10-26 | Discharge: 2018-10-26 | Disposition: A | Discharge status: Home / Self Care | Payer: BC Managed Care – PPO | Source: Ambulatory Visit | Attending: General Surgery | Admitting: General Surgery

## 2018-10-26 DIAGNOSIS — Z01812 Encounter for preprocedural laboratory examination: Secondary | ICD-10-CM | POA: Insufficient documentation

## 2018-10-26 DIAGNOSIS — Z1159 Encounter for screening for other viral diseases: Secondary | ICD-10-CM | POA: Diagnosis not present

## 2018-10-26 LAB — COMPREHENSIVE METABOLIC PANEL
ALT: 15 U/L (ref 0–44)
AST: 14 U/L — ABNORMAL LOW (ref 15–41)
Albumin: 4 g/dL (ref 3.5–5.0)
Alkaline Phosphatase: 68 U/L (ref 38–126)
Anion gap: 8 (ref 5–15)
BUN: 17 mg/dL (ref 6–20)
CO2: 26 mmol/L (ref 22–32)
Calcium: 9.3 mg/dL (ref 8.9–10.3)
Chloride: 106 mmol/L (ref 98–111)
Creatinine, Ser: 0.88 mg/dL (ref 0.44–1.00)
GFR calc Af Amer: >60 mL/min (ref >60)
GFR calc non Af Amer: >60 mL/min (ref >60)
Glucose, Bld: 97 mg/dL (ref 70–99)
Potassium: 4.7 mmol/L (ref 3.5–5.1)
Sodium: 140 mmol/L (ref 135–145)
Total Bilirubin: 0.8 mg/dL (ref 0.3–1.2)
Total Protein: 6.6 g/dL (ref 6.5–8.1)

## 2018-10-26 LAB — CBC WITH DIFFERENTIAL/PLATELET
Abs Immature Granulocytes: 0.02 10*3/uL (ref 0.00–0.07)
Basophils Absolute: 0.1 10*3/uL (ref 0.0–0.1)
Basophils Relative: 1 %
Eosinophils Absolute: 0.2 10*3/uL (ref 0.0–0.5)
Eosinophils Relative: 3 %
HCT: 41 % (ref 36.0–46.0)
Hemoglobin: 13.5 g/dL (ref 12.0–15.0)
Immature Granulocytes: 0 %
Lymphocytes Relative: 24 %
Lymphs Abs: 1.7 10*3/uL (ref 0.7–4.0)
MCH: 29.2 pg (ref 26.0–34.0)
MCHC: 32.9 g/dL (ref 30.0–36.0)
MCV: 88.6 fL (ref 80.0–100.0)
Monocytes Absolute: 0.4 10*3/uL (ref 0.1–1.0)
Monocytes Relative: 5 %
Neutro Abs: 4.7 10*3/uL (ref 1.7–7.7)
Neutrophils Relative %: 67 %
Platelets: 293 10*3/uL (ref 150–400)
RBC: 4.63 MIL/uL (ref 3.87–5.11)
RDW: 12.2 % (ref 11.5–15.5)
WBC: 7.1 10*3/uL (ref 4.0–10.5)
nRBC: 0 % (ref 0.0–0.2)

## 2018-10-26 LAB — SARS CORONAVIRUS 2 (TAT 6-24 HRS): SARS Coronavirus 2: NEGATIVE

## 2018-10-26 NOTE — Progress Notes (Signed)
Ensure drink and surgical soap given with instructions, pt verbalized understanding. 

## 2018-10-28 ENCOUNTER — Ambulatory Visit
Admission: RE | Admit: 2018-10-28 | Discharge: 2018-10-28 | Disposition: A | Discharge status: Home / Self Care | Payer: BC Managed Care – PPO | Source: Ambulatory Visit | Attending: General Surgery | Admitting: General Surgery

## 2018-10-28 ENCOUNTER — Other Ambulatory Visit: Payer: Self-pay

## 2018-10-28 DIAGNOSIS — R928 Other abnormal and inconclusive findings on diagnostic imaging of breast: Secondary | ICD-10-CM

## 2018-10-29 ENCOUNTER — Ambulatory Visit
Admission: RE | Admit: 2018-10-29 | Discharge: 2018-10-29 | Disposition: A | Discharge status: Home / Self Care | Payer: BC Managed Care – PPO | Source: Ambulatory Visit | Attending: General Surgery | Admitting: General Surgery

## 2018-10-29 ENCOUNTER — Encounter (HOSPITAL_BASED_OUTPATIENT_CLINIC_OR_DEPARTMENT_OTHER): Payer: Self-pay | Admitting: Certified Registered"

## 2018-10-29 ENCOUNTER — Other Ambulatory Visit: Payer: Self-pay

## 2018-10-29 ENCOUNTER — Ambulatory Visit (HOSPITAL_BASED_OUTPATIENT_CLINIC_OR_DEPARTMENT_OTHER)
Admission: RE | Admit: 2018-10-29 | Discharge: 2018-10-29 | Disposition: A | Discharge status: Home / Self Care | Payer: BC Managed Care – PPO | Attending: General Surgery | Admitting: General Surgery

## 2018-10-29 ENCOUNTER — Encounter (HOSPITAL_BASED_OUTPATIENT_CLINIC_OR_DEPARTMENT_OTHER)
Admission: RE | Disposition: A | Discharge status: Home / Self Care | Payer: Self-pay | Source: Home / Self Care | Attending: General Surgery

## 2018-10-29 ENCOUNTER — Ambulatory Visit (HOSPITAL_BASED_OUTPATIENT_CLINIC_OR_DEPARTMENT_OTHER): Payer: BC Managed Care – PPO | Admitting: Anesthesiology

## 2018-10-29 DIAGNOSIS — N6489 Other specified disorders of breast: Secondary | ICD-10-CM | POA: Diagnosis not present

## 2018-10-29 DIAGNOSIS — R928 Other abnormal and inconclusive findings on diagnostic imaging of breast: Secondary | ICD-10-CM

## 2018-10-29 DIAGNOSIS — N62 Hypertrophy of breast: Secondary | ICD-10-CM | POA: Diagnosis not present

## 2018-10-29 DIAGNOSIS — D242 Benign neoplasm of left breast: Secondary | ICD-10-CM | POA: Diagnosis not present

## 2018-10-29 DIAGNOSIS — Z803 Family history of malignant neoplasm of breast: Secondary | ICD-10-CM | POA: Diagnosis not present

## 2018-10-29 DIAGNOSIS — N6082 Other benign mammary dysplasias of left breast: Secondary | ICD-10-CM | POA: Diagnosis not present

## 2018-10-29 HISTORY — PX: BREAST LUMPECTOMY WITH RADIOACTIVE SEED LOCALIZATION: SHX6424

## 2018-10-29 HISTORY — DX: Other abnormal and inconclusive findings on diagnostic imaging of breast: R92.8

## 2018-10-29 SURGERY — BREAST LUMPECTOMY WITH RADIOACTIVE SEED LOCALIZATION
Anesthesia: General | Site: Breast | Laterality: Left

## 2018-10-29 MED ORDER — LIDOCAINE 2% (20 MG/ML) 5 ML SYRINGE
INTRAMUSCULAR | Status: DC | PRN
  Administered 2018-10-29: 60 mg via INTRAVENOUS

## 2018-10-29 MED ORDER — FENTANYL CITRATE (PF) 100 MCG/2ML IJ SOLN
INTRAMUSCULAR | Status: AC
  Filled 2018-10-29: qty 2

## 2018-10-29 MED ORDER — FENTANYL CITRATE (PF) 250 MCG/5ML IJ SOLN
INTRAMUSCULAR | Status: DC | PRN
  Administered 2018-10-29: 100 ug via INTRAVENOUS

## 2018-10-29 MED ORDER — CHLORHEXIDINE GLUCONATE CLOTH 2 % EX PADS: 6.0000 | MEDICATED_PAD | Freq: Once | CUTANEOUS | Status: DC

## 2018-10-29 MED ORDER — CEFAZOLIN SODIUM-DEXTROSE 2-4 GM/100ML-% IV SOLN
INTRAVENOUS | Status: AC
  Filled 2018-10-29: qty 100

## 2018-10-29 MED ORDER — PROPOFOL 10 MG/ML IV BOLUS
INTRAVENOUS | Status: DC | PRN
  Administered 2018-10-29: 190 mg via INTRAVENOUS

## 2018-10-29 MED ORDER — OXYCODONE HCL 5 MG/5ML PO SOLN: 5.0000 mg | Freq: Once | ORAL | Status: DC | PRN

## 2018-10-29 MED ORDER — MIDAZOLAM HCL 2 MG/2ML IJ SOLN
INTRAMUSCULAR | Status: AC
  Filled 2018-10-29: qty 2

## 2018-10-29 MED ORDER — SCOPOLAMINE 1 MG/3DAYS TD PT72: 1.0000 | MEDICATED_PATCH | Freq: Once | TRANSDERMAL | Status: DC

## 2018-10-29 MED ORDER — FENTANYL CITRATE (PF) 100 MCG/2ML IJ SOLN: 25.0000 ug | INTRAMUSCULAR | Status: DC | PRN

## 2018-10-29 MED ORDER — LACTATED RINGERS IV SOLN
INTRAVENOUS | Status: DC
  Administered 2018-10-29: 11:00:00 via INTRAVENOUS

## 2018-10-29 MED ORDER — DEXAMETHASONE SODIUM PHOSPHATE 10 MG/ML IJ SOLN
INTRAMUSCULAR | Status: AC
  Filled 2018-10-29: qty 1

## 2018-10-29 MED ORDER — SODIUM CHLORIDE 0.9% FLUSH: 3.0000 mL | Freq: Two times a day (BID) | INTRAVENOUS | Status: DC

## 2018-10-29 MED ORDER — MIDAZOLAM HCL 2 MG/2ML IJ SOLN: 1.0000 mg | INTRAMUSCULAR | Status: DC | PRN

## 2018-10-29 MED ORDER — FENTANYL CITRATE (PF) 100 MCG/2ML IJ SOLN: 50.0000 ug | INTRAMUSCULAR | Status: DC | PRN

## 2018-10-29 MED ORDER — BUPIVACAINE-EPINEPHRINE (PF) 0.5% -1:200000 IJ SOLN
INTRAMUSCULAR | Status: DC | PRN
  Administered 2018-10-29: 7 mL

## 2018-10-29 MED ORDER — HYDROCODONE-ACETAMINOPHEN 5-325 MG PO TABS: 1.0000 | ORAL_TABLET | Freq: Four times a day (QID) | ORAL | 0 refills | Status: DC | PRN

## 2018-10-29 MED ORDER — ONDANSETRON HCL 4 MG/2ML IJ SOLN
INTRAMUSCULAR | Status: DC | PRN
  Administered 2018-10-29: 4 mg via INTRAVENOUS

## 2018-10-29 MED ORDER — PROPOFOL 10 MG/ML IV BOLUS
INTRAVENOUS | Status: AC
  Filled 2018-10-29: qty 20

## 2018-10-29 MED ORDER — PROMETHAZINE HCL 25 MG/ML IJ SOLN: 6.2500 mg | INTRAMUSCULAR | Status: DC | PRN

## 2018-10-29 MED ORDER — DEXAMETHASONE SODIUM PHOSPHATE 10 MG/ML IJ SOLN
INTRAMUSCULAR | Status: DC | PRN
  Administered 2018-10-29: 5 mg via INTRAVENOUS

## 2018-10-29 MED ORDER — OXYCODONE HCL 5 MG PO TABS: 5.0000 mg | ORAL_TABLET | Freq: Once | ORAL | Status: DC | PRN

## 2018-10-29 MED ORDER — MIDAZOLAM HCL 5 MG/5ML IJ SOLN
INTRAMUSCULAR | Status: DC | PRN
  Administered 2018-10-29: 2 mg via INTRAVENOUS

## 2018-10-29 MED ORDER — EPHEDRINE 5 MG/ML INJ
INTRAVENOUS | Status: AC
  Filled 2018-10-29: qty 10

## 2018-10-29 MED ORDER — CEFAZOLIN SODIUM-DEXTROSE 2-4 GM/100ML-% IV SOLN
2.0000 g | INTRAVENOUS | Status: AC
  Administered 2018-10-29: 2 g via INTRAVENOUS

## 2018-10-29 MED ORDER — LIDOCAINE 2% (20 MG/ML) 5 ML SYRINGE
INTRAMUSCULAR | Status: AC
  Filled 2018-10-29: qty 5

## 2018-10-29 SURGICAL SUPPLY — 66 items
ADH SKN CLS APL DERMABOND .7 (GAUZE/BANDAGES/DRESSINGS) ×1
APL PRP STRL LF DISP 70% ISPRP (MISCELLANEOUS) ×1
APL SKNCLS STERI-STRIP NONHPOA (GAUZE/BANDAGES/DRESSINGS)
APPLIER CLIP 9.375 MED OPEN (MISCELLANEOUS) ×2
APR CLP MED 9.3 20 MLT OPN (MISCELLANEOUS) ×1
BENZOIN TINCTURE PRP APPL 2/3 (GAUZE/BANDAGES/DRESSINGS) IMPLANT
BINDER BREAST LRG (GAUZE/BANDAGES/DRESSINGS) ×1 IMPLANT
BINDER BREAST MEDIUM (GAUZE/BANDAGES/DRESSINGS) IMPLANT
BINDER BREAST XLRG (GAUZE/BANDAGES/DRESSINGS) IMPLANT
BINDER BREAST XXLRG (GAUZE/BANDAGES/DRESSINGS) IMPLANT
BLADE HEX COATED 2.75 (ELECTRODE) ×2 IMPLANT
BLADE SURG 10 STRL SS (BLADE) IMPLANT
BLADE SURG 15 STRL LF DISP TIS (BLADE) ×1 IMPLANT
BLADE SURG 15 STRL SS (BLADE) ×2
CANISTER SUC SOCK COL 7IN (MISCELLANEOUS) IMPLANT
CANISTER SUCT 1200ML W/VALVE (MISCELLANEOUS) ×2 IMPLANT
CHLORAPREP W/TINT 26 (MISCELLANEOUS) ×2 IMPLANT
CLIP APPLIE 9.375 MED OPEN (MISCELLANEOUS) IMPLANT
COVER BACK TABLE REUSABLE LG (DRAPES) ×2 IMPLANT
COVER MAYO STAND REUSABLE (DRAPES) ×2 IMPLANT
COVER PROBE W GEL 5X96 (DRAPES) ×2 IMPLANT
COVER WAND RF STERILE (DRAPES) IMPLANT
DECANTER SPIKE VIAL GLASS SM (MISCELLANEOUS) IMPLANT
DERMABOND ADVANCED (GAUZE/BANDAGES/DRESSINGS) ×1
DERMABOND ADVANCED .7 DNX12 (GAUZE/BANDAGES/DRESSINGS) ×1 IMPLANT
DRAPE LAPAROSCOPIC ABDOMINAL (DRAPES) ×2 IMPLANT
DRAPE UTILITY XL STRL (DRAPES) ×2 IMPLANT
DRSG PAD ABDOMINAL 8X10 ST (GAUZE/BANDAGES/DRESSINGS) ×2 IMPLANT
ELECT REM PT RETURN 9FT ADLT (ELECTROSURGICAL) ×2
ELECTRODE REM PT RTRN 9FT ADLT (ELECTROSURGICAL) ×1 IMPLANT
GAUZE SPONGE 4X4 12PLY STRL LF (GAUZE/BANDAGES/DRESSINGS) ×2 IMPLANT
GLOVE BIO SURGEON STRL SZ 6.5 (GLOVE) ×1 IMPLANT
GLOVE BIOGEL PI IND STRL 6.5 (GLOVE) IMPLANT
GLOVE BIOGEL PI INDICATOR 6.5 (GLOVE) ×1
GLOVE EUDERMIC 7 POWDERFREE (GLOVE) ×2 IMPLANT
GLOVE EXAM NITRILE MD LF STRL (GLOVE) ×1 IMPLANT
GOWN STRL REUS W/ TWL LRG LVL3 (GOWN DISPOSABLE) ×1 IMPLANT
GOWN STRL REUS W/ TWL XL LVL3 (GOWN DISPOSABLE) ×1 IMPLANT
GOWN STRL REUS W/TWL LRG LVL3 (GOWN DISPOSABLE) ×1 IMPLANT
GOWN STRL REUS W/TWL XL LVL3 (GOWN DISPOSABLE) ×2
ILLUMINATOR WAVEGUIDE N/F (MISCELLANEOUS) IMPLANT
KIT MARKER MARGIN INK (KITS) ×2 IMPLANT
LIGHT WAVEGUIDE WIDE FLAT (MISCELLANEOUS) IMPLANT
NDL HYPO 25X1 1.5 SAFETY (NEEDLE) ×1 IMPLANT
NEEDLE HYPO 25X1 1.5 SAFETY (NEEDLE) ×2 IMPLANT
NS IRRIG 1000ML POUR BTL (IV SOLUTION) ×2 IMPLANT
PACK BASIN DAY SURGERY FS (CUSTOM PROCEDURE TRAY) ×2 IMPLANT
PENCIL BUTTON HOLSTER BLD 10FT (ELECTRODE) ×2 IMPLANT
SHEET MEDIUM DRAPE 40X70 STRL (DRAPES) IMPLANT
SLEEVE SCD COMPRESS KNEE MED (MISCELLANEOUS) ×2 IMPLANT
SPONGE LAP 18X18 RF (DISPOSABLE) IMPLANT
SPONGE LAP 4X18 RFD (DISPOSABLE) ×3 IMPLANT
STRIP CLOSURE SKIN 1/2X4 (GAUZE/BANDAGES/DRESSINGS) IMPLANT
SUT ETHILON 3 0 FSL (SUTURE) IMPLANT
SUT MNCRL AB 4-0 PS2 18 (SUTURE) ×2 IMPLANT
SUT SILK 2 0 SH (SUTURE) ×2 IMPLANT
SUT VIC AB 2-0 CT1 27 (SUTURE)
SUT VIC AB 2-0 CT1 TAPERPNT 27 (SUTURE) IMPLANT
SUT VIC AB 3-0 SH 27 (SUTURE)
SUT VIC AB 3-0 SH 27X BRD (SUTURE) IMPLANT
SUT VICRYL 3-0 CR8 SH (SUTURE) ×2 IMPLANT
SYR 10ML LL (SYRINGE) ×2 IMPLANT
TOWEL GREEN STERILE FF (TOWEL DISPOSABLE) ×2 IMPLANT
TRAY FAXITRON CT DISP (TRAY / TRAY PROCEDURE) ×2 IMPLANT
TUBE CONNECTING 20X1/4 (TUBING) ×2 IMPLANT
YANKAUER SUCT BULB TIP NO VENT (SUCTIONS) ×2 IMPLANT

## 2018-10-29 NOTE — Anesthesia Procedure Notes (Signed)
Procedure Name: LMA Insertion Date/Time: 10/29/2018 11:31 AM Performed by: Myna Bright, CRNA Pre-anesthesia Checklist: Patient identified, Emergency Drugs available, Suction available and Patient being monitored Patient Re-evaluated:Patient Re-evaluated prior to induction Oxygen Delivery Method: Circle system utilized Preoxygenation: Pre-oxygenation with 100% oxygen Induction Type: IV induction Ventilation: Mask ventilation without difficulty LMA: LMA inserted LMA Size: 4.0 Tube type: Oral Number of attempts: 1 Placement Confirmation: positive ETCO2 and breath sounds checked- equal and bilateral Tube secured with: Tape Dental Injury: Teeth and Oropharynx as per pre-operative assessment

## 2018-10-29 NOTE — Discharge Instructions (Signed)
[place] Office Phone Number [phone]  BREAST BIOPSY/ PARTIAL MASTECTOMY: POST OP INSTRUCTIONS  Always review your discharge instruction sheet given to you by the facility where your surgery was performed.  IF YOU HAVE DISABILITY OR FAMILY LEAVE FORMS, YOU MUST BRING THEM TO THE OFFICE FOR PROCESSING.  DO NOT GIVE THEM TO YOUR DOCTOR.  1. A prescription for pain medication may be given to you upon discharge.  Take your pain medication as prescribed, if needed.  If narcotic pain medicine is not needed, then you may take acetaminophen (Tylenol) or ibuprofen (Advil) as needed. 2. Take your usually prescribed medications unless otherwise directed 3. If you need a refill on your pain medication, please contact your pharmacy.  They will contact our office to request authorization.  Prescriptions will not be filled after 5pm or on week-ends. 4. You should eat very light the first 24 hours after surgery, such as soup, crackers, pudding, etc.  Resume your normal diet the day after surgery. 5. Most patients will experience some swelling and bruising in the breast.  Ice packs and a good support bra will help.  Swelling and bruising can take several days to resolve.  6. It is common to experience some constipation if taking pain medication after surgery.  Increasing fluid intake and taking a stool softener will usually help or prevent this problem from occurring.  A mild laxative (Milk of Magnesia or Miralax) should be taken according to package directions if there are no bowel movements after 48 hours. 7. Unless discharge instructions indicate otherwise, you may remove your bandages 24-48 hours after surgery, and you may shower at that time.  You may have steri-strips (small skin tapes) in place directly over the incision.  These strips should be left on the skin for 7-10 days.  If your surgeon used skin glue on the incision, you may shower in 24 hours.  The glue will flake off over the  next 2-3 weeks.  Any sutures or staples will be removed at the office during your follow-up visit. 8. ACTIVITIES:  You may resume regular daily activities (gradually increasing) beginning the next day.  Wearing a good support bra or sports bra minimizes pain and swelling.  You may have sexual intercourse when it is comfortable. a. You may drive when you no longer are taking prescription pain medication, you can comfortably wear a seatbelt, and you can safely maneuver your car and apply brakes. b. RETURN TO WORK:  ______________________________________________________________________________________ 9. You should see your doctor in the office for a follow-up appointment approximately two weeks after your surgery.  Your doctors nurse will typically make your follow-up appointment when she calls you with your pathology report.  Expect your pathology report 2-3 business days after your surgery.  You may call to check if you do not hear from [place] after three days. 10. OTHER INSTRUCTIONS: _______________________________________________________________________________________________ _____________________________________________________________________________________________________________________________________ _____________________________________________________________________________________________________________________________________ _____________________________________________________________________________________________________________________________________  WHEN TO CALL YOUR DOCTOR: 1. Fever over 101.0 2. Nausea and/or vomiting. 3. Extreme swelling or bruising. 4. Continued bleeding from incision. 5. Increased pain, redness, or drainage from the incision.  The clinic staff is available to answer your questions during regular business hours.  Please dont hesitate to call and ask to speak to one of the nurses for clinical concerns.  If you have a medical emergency, go to the nearest  emergency room or call 911.  A surgeon from [facility] is always on call at the hospital.  For further questions, please visit [email or web address]  Managing Your Pain After Surgery Without Opioids    Thank you for participating in our program to help patients manage their pain after surgery without opioids. This is part of our effort to provide you with the best care possible, without exposing you or your family to the risk that opioids pose.  What pain can I expect after surgery? You can expect to have some pain after surgery. This is normal. The pain is typically worse the day after surgery, and quickly begins to get better. Many studies have found that many patients are able to manage their pain after surgery with Over-the-Counter (OTC) medications such as Tylenol and Motrin. If you have a condition that does not allow you to take Tylenol or Motrin, notify your surgical team.  How will I manage my pain? The best strategy for controlling your pain after surgery is around the clock pain control with Tylenol (acetaminophen) and Motrin (ibuprofen or Advil). Alternating these medications with each other allows you to maximize your pain control. In addition to Tylenol and Motrin, you can use heating pads or ice packs on your incisions to help reduce your pain.  How will I alternate your regular strength over-the-counter pain medication? You will take a dose of pain medication every three hours. ; Start by taking 650 mg of Tylenol (2 pills of 325 mg) ; 3 hours later take 600 mg of Motrin (3 pills of 200 mg) ; 3 hours after taking the Motrin take 650 mg of Tylenol ; 3 hours after that take 600 mg of Motrin.   - 1 -  See example - if your first dose of Tylenol is at 12:00 PM   12:00 PM Tylenol 650 mg (2 pills of 325 mg)  3:00 PM Motrin 600 mg (3 pills of 200 mg)  6:00 PM Tylenol 650 mg (2 pills of 325 mg)  9:00 PM Motrin 600 mg (3 pills of  200 mg)  Continue alternating every 3 hours   We recommend that you follow this schedule around-the-clock for at least 3 days after surgery, or until you feel that it is no longer needed. Use the table on the last page of this handout to keep track of the medications you are taking. Important: Do not take more than 3033m of Tylenol or 32012mof Motrin in a 24-hour period. Do not take ibuprofen/Motrin if you have a history of bleeding stomach ulcers, severe kidney disease, &/or actively taking a blood thinner  What if I still have pain? If you have pain that is not controlled with the over-the-counter pain medications (Tylenol and Motrin or Advil) you might have what we call breakthrough pain. You will receive a prescription for a small amount of an opioid pain medication such as Oxycodone, Tramadol, or Tylenol with Codeine. Use these opioid pills in the first 24 hours after surgery if you have breakthrough pain. Do not take more than 1 pill every 4-6 hours.  If you still have uncontrolled pain after using all opioid pills, don't hesitate to call our staff using the number provided. We will help make sure you are managing your pain in the best way possible, and if necessary, we can provide a prescription for additional pain medication.   Day 1    Time  Name of Medication Number of pills taken  Amount of Acetaminophen  Pain Level   Comments  AM PM       AM PM       AM PM  AM PM       AM PM       AM PM       AM PM       AM PM       Total Daily amount of Acetaminophen Do not take more than  3,000 mg per day      Day 2    Time  Name of Medication Number of pills taken  Amount of Acetaminophen  Pain Level   Comments  AM PM       AM PM       AM PM       AM PM       AM PM       AM PM       AM PM       AM PM       Total Daily amount of Acetaminophen Do not take more than  3,000 mg per day      Day 3    Time  Name of Medication Number of pills taken  Amount of  Acetaminophen  Pain Level   Comments  AM PM       AM PM       AM PM       AM PM          AM PM       AM PM       AM PM       AM PM       Total Daily amount of Acetaminophen Do not take more than  3,000 mg per day      Day 4    Time  Name of Medication Number of pills taken  Amount of Acetaminophen  Pain Level   Comments  AM PM       AM PM       AM PM       AM PM       AM PM       AM PM       AM PM       AM PM       Total Daily amount of Acetaminophen Do not take more than  3,000 mg per day      Day 5    Time  Name of Medication Number of pills taken  Amount of Acetaminophen  Pain Level   Comments  AM PM       AM PM       AM PM       AM PM       AM PM       AM PM       AM PM       AM PM       Total Daily amount of Acetaminophen Do not take more than  3,000 mg per day       Day 6    Time  Name of Medication Number of pills taken  Amount of Acetaminophen  Pain Level  Comments  AM PM       AM PM       AM PM       AM PM       AM PM       AM PM       AM PM       AM PM       Total Daily amount of Acetaminophen Do not take more than  3,000 mg per day      Day 7    Time  Name of Medication Number of pills taken  Amount of Acetaminophen  Pain Level   Comments  AM PM       AM PM       AM PM       AM PM       AM PM       AM PM       AM PM       AM PM       Total Daily amount of Acetaminophen Do not take more than  3,000 mg per day        For additional information about how and where to safely dispose of unused opioid medications - [employer]  Disclaimer: This document contains information and/or instructional materials adapted from [place] for the typical patient with your condition. It does not replace medical advice from your health care provider because your experience may differ from that of the typical patient. Talk to your health care provider if you have any questions about  this document, your condition or your treatment plan. Adapted from [place] Instructions  Activity: Get plenty of rest for the remainder of the day. A responsible individual must stay with you for 24 hours following the procedure.  For the next 24 hours, DO NOT: -Drive a car -Paediatric nurse -Drink alcoholic beverages -Take any medication unless instructed by your physician -Make any legal decisions or sign important papers.  Meals: Start with liquid foods such as gelatin or soup. Progress to regular foods as tolerated. Avoid greasy, spicy, heavy foods. If nausea and/or vomiting occur, drink only clear liquids until the nausea and/or vomiting subsides. Call your physician if vomiting continues.  Special Instructions/Symptoms: Your throat may feel dry or sore from the anesthesia or the breathing tube placed in your throat during surgery. If this causes discomfort, gargle with warm salt water. The discomfort should disappear within 24 hours.  If you had a scopolamine patch placed behind your ear for the management of post- operative nausea and/or vomiting:  1. The medication in the patch is effective for 72 hours, after which it should be removed.  Wrap patch in a tissue and discard in the trash. Wash hands thoroughly with soap and water. 2. You may remove the patch earlier than 72 hours if you experience unpleasant side effects which may include dry mouth, dizziness or visual disturbances. 3. Avoid touching the patch. Wash your hands with soap and water after contact with the patch.

## 2018-10-29 NOTE — Transfer of Care (Signed)
Immediate Anesthesia Transfer of Care Note  Patient: Sarah May  Procedure(s) Performed: LEFT BREAST LUMPECTOMY X2 WITH RADIOACTIVE SEED LOCALIZATION X2 (Left Breast)  Patient Location: PACU  Anesthesia Type:General  Level of Consciousness: awake  Airway & Oxygen Therapy: Patient connected to nasal cannula oxygen  Post-op Assessment: Report given to RN  Post vital signs: stable  Last Vitals:  Vitals Value Taken Time  BP 121/79 10/29/18 1320  Temp 37.1 C 10/29/18 1233  Pulse 71 10/29/18 1321  Resp 14 10/29/18 1321  SpO2 98 % 10/29/18 1321    Last Pain:  Vitals:   10/29/18 1313  TempSrc:   PainSc: 3       Patients Stated Pain Goal: 3 (35/57/32 2025)  Complications: No apparent anesthesia complications

## 2018-10-29 NOTE — Anesthesia Preprocedure Evaluation (Addendum)
Anesthesia Evaluation  Patient identified by MRN, date of birth, ID band Patient awake    Reviewed: Allergy & Precautions, NPO status , Patient's Chart, lab work & pertinent test results  History of Anesthesia Complications Negative for: history of anesthetic complications  Airway Mallampati: II  TM Distance: >3 FB Neck ROM: Full    Dental  (+) Dental Advisory Given, Teeth Intact   Pulmonary neg pulmonary ROS,    breath sounds clear to auscultation       Cardiovascular negative cardio ROS   Rhythm:Regular Rate:Normal     Neuro/Psych negative neurological ROS  negative psych ROS   GI/Hepatic negative GI ROS, Neg liver ROS,   Endo/Other  negative endocrine ROS  Renal/GU negative Renal ROS     Musculoskeletal negative musculoskeletal ROS (+)   Abdominal   Peds  Hematology negative hematology ROS (+)   Anesthesia Other Findings   Reproductive/Obstetrics  LEFT BREAST COMPLEX SCLEROSING LESION                             Anesthesia Physical Anesthesia Plan  ASA: I  Anesthesia Plan: General   Post-op Pain Management:    Induction: Intravenous  PONV Risk Score and Plan: 3 and Treatment may vary due to age or medical condition, Ondansetron, Dexamethasone and Midazolam  Airway Management Planned: LMA  Additional Equipment: None  Intra-op Plan:   Post-operative Plan: Extubation in OR  Informed Consent: I have reviewed the patients History and Physical, chart, labs and discussed the procedure including the risks, benefits and alternatives for the proposed anesthesia with the patient or authorized representative who has indicated his/her understanding and acceptance.     Dental advisory given  Plan Discussed with: CRNA and Anesthesiologist  Anesthesia Plan Comments:        Anesthesia Quick Evaluation

## 2018-10-29 NOTE — Interval H&P Note (Signed)
History and Physical Interval Note:  10/29/2018 11:09 AM  [name]  has presented today for surgery, with the diagnosis of LEFT BREAST COMPLEX SCLEROSING LESION.  The various methods of treatment have been discussed with the patient and family. After consideration of risks, benefits and other options for treatment, the patient has consented to  Procedure(s): LEFT BREAST LUMPECTOMY X2 WITH RADIOACTIVE SEED LOCALIZATION X2 (Left) as a surgical intervention.  The patient's history has been reviewed, patient examined, no change in status, stable for surgery.  I have reviewed the patient's chart and labs.  Questions were answered to the patient's satisfaction.     Sarah May

## 2018-10-29 NOTE — Op Note (Signed)
Patient Name:           Sarah May   Date of Surgery:        10/29/2018  Pre op Diagnosis:      Complex sclerosing lesion left breast, multifocal  Post op Diagnosis:    Same  Procedure:                 Left breast lumpectomy x2 with radioactive seed localization x2  Surgeon:                     Edsel Petrin. Dalbert Batman, M.D., FACS  Assistant:                      OR staff  Operative Indications:   The patient is a 58 year old female from [place] who presents with a complaint of complex sclerosing lesion left breast 2 areas. Dr. Gerarda Fraction is her PCP. She is referred by the [facility] for complex sclerosing lesion left breast, 2 periareolar areas       She has never had a breast problem. Gets annual mammography. Recent mammogram showed 2 areas of architectural distortion the left breast. 1 retroareolar area and one medial retroareolar area. Ultrasound the axilla is negative. These areas are 1.7 cm apart. Both of these areas were biopsied. On the path report they are labeled upper inner and superior and they both showed complex sclerosing lesion, usual ductal hyperplasia, no atypia she was referred for excision     Past history is negative.  family history reveals sister living with a history of breast cancer. Maternal aunt died of breast cancer.       I discussed the nature of her biopsy findings in the histologic problem. I explained that this is a high risk lesion occasionally anywhere from a 5-9% risk of having cancer at this time. Excision is recommended and she completely agrees. I discussed circumareolar incision. I discussed the placement of 2 radioactive seeds. She will be scheduled for left breast lumpectomy 2 with radioactive seed localization 2. I discussed the indications, details, techniques, numerous risk of the surgery with her. She is aware of the risk of bleeding, infection, reoperation if cancer, cosmetic deformity, chronic pain, partial or complete  disruption of the sensation to the nipple. She understands all of these issues. All of her questions are answered. She agrees with this plan.      Once we get the final pathology she should be referred for g we were able to make a superiorly placed circumareolar incision to get both of these areas out.  Testing and high risk clinic, if that seems appropriate  Operative Findings:       The original biopsy clips and the 2 radioactive seeds were present in the specimen mammogram.  One was medial and one was lateral.  Procedure in Detail:          Following the induction of general LMA anesthesia the patient's left breast was prepped and draped in a sterile fashion.  Surgical timeout was performed.  Intravenous antibiotics were given.  0.5% Marcaine with epinephrine was used as local infiltration anesthetic.     Using the neoprobe I identified the radioactive signal in the retroareolar area.  This was a strong signal but it was hard to discriminate between the 2 as they were fairly close to each other.  A superiorly placed circumareolar incision was made with a knife.  The lumpectomy was performed using  electrocautery and the neoprobe.  The specimen was removed and marked with silk sutures and a 6 color ink kit to orient the pathologist.  The specimen mammogram looked good as described above.  The specimen was sent to the pathology lab where the seeds were retrieved.  Hemostasis was excellent.  The wound was irrigated.  I placed 5 metal marker clips in the walls of the lumpectomy cavity.  I closed the wound in several layers with interrupted 3-0 Vicryl and the skin with a running subcuticular 4-0 Monocryl and Dermabond.  Clean bandages and a breast binder were placed.  The patient tolerated the procedure well and was taken to PACU in stable condition.  EBL 20 cc.  Counts correct.  Complications none.    Addendum: I logged onto the PMP aware website and reviewed her prescription medication  history     Haywood M. Dalbert Batman, M.D., FACS General and Minimally Invasive Surgery Breast and Colorectal Surgery  10/29/2018 12:28 PM

## 2018-10-29 NOTE — Anesthesia Postprocedure Evaluation (Signed)
Anesthesia Post Note  Patient: Sarah May  Procedure(s) Performed: LEFT BREAST LUMPECTOMY X2 WITH RADIOACTIVE SEED LOCALIZATION X2 (Left Breast)     Patient location during evaluation: PACU Anesthesia Type: General Level of consciousness: awake and alert Pain management: pain level controlled Vital Signs Assessment: post-procedure vital signs reviewed and stable Respiratory status: spontaneous breathing, nonlabored ventilation and respiratory function stable Cardiovascular status: blood pressure returned to baseline and stable Postop Assessment: no apparent nausea or vomiting Anesthetic complications: no    Last Vitals:  Vitals:   10/29/18 1320 10/29/18 1321  BP: 121/79   Pulse: 72 71  Resp: 15 14  Temp:    SpO2: 98% 98%    Last Pain:  Vitals:   10/29/18 1313  TempSrc:   PainSc: 3                  Audry Pili

## 2018-11-01 ENCOUNTER — Encounter (HOSPITAL_BASED_OUTPATIENT_CLINIC_OR_DEPARTMENT_OTHER): Payer: Self-pay | Admitting: General Surgery

## 2018-11-01 NOTE — Progress Notes (Signed)
Inform patient of Pathology report,. Breast pathology is benign. Radial scar and hyperplasia. No cancer of any kind. I will discuss with her at next OV on detail.  Let me know you reached her.  Thanks, Dalbert Batman

## 2019-01-25 ENCOUNTER — Other Ambulatory Visit: Payer: Self-pay

## 2019-01-25 DIAGNOSIS — Z20822 Contact with and (suspected) exposure to covid-19: Secondary | ICD-10-CM

## 2019-01-25 DIAGNOSIS — Z20828 Contact with and (suspected) exposure to other viral communicable diseases: Secondary | ICD-10-CM | POA: Diagnosis not present

## 2019-01-27 LAB — NOVEL CORONAVIRUS, NAA: SARS-CoV-2, NAA: NOT DETECTED

## 2019-05-03 ENCOUNTER — Other Ambulatory Visit: Payer: Self-pay

## 2019-06-29 ENCOUNTER — Other Ambulatory Visit (HOSPITAL_COMMUNITY): Payer: Self-pay | Admitting: Internal Medicine

## 2019-06-29 ENCOUNTER — Other Ambulatory Visit (HOSPITAL_COMMUNITY): Payer: Self-pay | Admitting: Family Medicine

## 2019-06-29 DIAGNOSIS — Z1231 Encounter for screening mammogram for malignant neoplasm of breast: Secondary | ICD-10-CM

## 2019-07-02 ENCOUNTER — Ambulatory Visit: Payer: 59 | Attending: Internal Medicine

## 2019-07-02 DIAGNOSIS — Z23 Encounter for immunization: Secondary | ICD-10-CM

## 2019-07-02 NOTE — Progress Notes (Signed)
   Covid-19 Vaccination Clinic  Name:  Sarah May    MRN: [id number] DOB: 12-31-1960  07/02/2019  [name] was observed post Covid-19 immunization for 15 minutes without incident. She was provided with Vaccine Information Sheet and instruction to access the V-Safe system.   [name] was instructed to call 911 with any severe reactions post vaccine: Marland Kitchen Difficulty breathing  . Swelling of face and throat  . A fast heartbeat  . A bad rash all over body  . Dizziness and weakness   Immunizations Administered    Name Date Dose VIS Date Route   Moderna COVID-19 Vaccine 07/02/2019 10:14 AM 0.5 mL 03/22/2019 Intramuscular   Manufacturer: Moderna   Lot: [id number]   [place]:[id number]

## 2019-08-03 ENCOUNTER — Ambulatory Visit: Payer: 59 | Attending: Internal Medicine

## 2019-08-03 DIAGNOSIS — Z23 Encounter for immunization: Secondary | ICD-10-CM

## 2019-08-03 NOTE — Progress Notes (Signed)
   Covid-19 Vaccination Clinic  Name:  Sarah May    MRN: [id number] DOB: 02/12/1961  08/03/2019  [name] was observed post Covid-19 immunization for 15 minutes without incident. She was provided with Vaccine Information Sheet and instruction to access the V-Safe system.   [name] was instructed to call 911 with any severe reactions post vaccine: Marland Kitchen Difficulty breathing  . Swelling of face and throat  . A fast heartbeat  . A bad rash all over body  . Dizziness and weakness   Immunizations Administered    Name Date Dose VIS Date Route   Moderna COVID-19 Vaccine 08/03/2019  8:33 AM 0.5 mL 03/22/2019 Intramuscular   Manufacturer: Moderna   Lot: [id number]   [id number]

## 2019-08-25 ENCOUNTER — Other Ambulatory Visit (HOSPITAL_COMMUNITY)
Admission: RE | Admit: 2019-08-25 | Discharge: 2019-08-25 | Disposition: A | Discharge status: Home / Self Care | Payer: 59 | Source: Ambulatory Visit | Attending: Adult Health | Admitting: Adult Health

## 2019-08-25 ENCOUNTER — Other Ambulatory Visit: Payer: Self-pay

## 2019-08-25 ENCOUNTER — Ambulatory Visit (INDEPENDENT_AMBULATORY_CARE_PROVIDER_SITE_OTHER): Payer: 59 | Admitting: Adult Health

## 2019-08-25 ENCOUNTER — Encounter: Payer: Self-pay | Admitting: Adult Health

## 2019-08-25 VITALS — BP 130/83 | HR 67 | Ht 64.0 in | Wt 158.6 lb

## 2019-08-25 DIAGNOSIS — Z1272 Encounter for screening for malignant neoplasm of vagina: Secondary | ICD-10-CM

## 2019-08-25 DIAGNOSIS — Z1212 Encounter for screening for malignant neoplasm of rectum: Secondary | ICD-10-CM

## 2019-08-25 DIAGNOSIS — Z01419 Encounter for gynecological examination (general) (routine) without abnormal findings: Secondary | ICD-10-CM | POA: Insufficient documentation

## 2019-08-25 DIAGNOSIS — Z1211 Encounter for screening for malignant neoplasm of colon: Secondary | ICD-10-CM | POA: Diagnosis not present

## 2019-08-25 LAB — HEMOCCULT GUIAC POC 1CARD (OFFICE): Fecal Occult Blood, POC: NEGATIVE

## 2019-08-25 NOTE — Progress Notes (Signed)
Patient ID: Sarah May, female   DOB: Mar 18, 1961, 59 y.o.   MRN: [id number] History of Present Illness: Sarah May is a 59 year old white female,single, PM in for well woman gyn exam and pap. Recently treated for H pylori.  She had benign breast biopsy last year. She is working PT at [employer] PCP is Dr Gerarda Fraction.    Current Medications, Allergies, Past Medical History, Past Surgical History, Family History and Social History were reviewed in [place] record.     Review of Systems:  Patient denies any headaches, hearing loss, fatigue, blurred vision, shortness of breath, chest pain, abdominal pain, problems with bowel movements, urination, or intercourse(not active). No joint pain or mood swings. No vaginal bleeding   Physical Exam:BP 130/83 (BP Location: Right Arm, Patient Position: Sitting, Cuff Size: Normal)   Pulse 67   Ht 5\' 4"  (1.626 m)   Wt 158 lb 9.6 oz (71.9 kg)   LMP 06/10/2011   BMI 27.22 kg/m  General:  Well developed, well nourished, no acute distress Skin:  Warm and dry Neck:  Midline trachea, normal thyroid, good ROM, no lymphadenopathy Lungs; Clear to auscultation bilaterally Breast:  No dominant palpable mass, retraction, or nipple discharge Cardiovascular: Regular rate and rhythm Abdomen:  Soft, non tender, no hepatosplenomegaly Pelvic:  External genitalia is normal in appearance, no lesions.  The vagina is pale with loss of moisture and rugae. Urethra has no lesions or masses. The cervix is smooth, pap with high risk HPV 16/18 genotyping performed.  Uterus is felt to be normal size, shape, and contour.  No adnexal masses or tenderness noted.Bladder is non tender, no masses felt. Rectal: Good sphincter tone, no polyps, or hemorrhoids felt.  Hemoccult negative. Extremities/musculoskeletal:  No swelling or varicosities noted, no clubbing or cyanosis Psych:  No mood changes, alert and cooperative,seems happy AA 0 Fall risk is low PHQ 9 score is  0 Examination chaperoned by Rolena Infante LPN  Impression and Plan: 1. Encounter for gynecological examination with Papanicolaou smear of cervix Pap sent Physical in 1 year Pap in 3 if normal Mammogram yearly Labs with PCP  2. Screening for colorectal cancer Colonoscopy next year

## 2019-08-30 LAB — CYTOLOGY - PAP
Comment: NEGATIVE
Diagnosis: NEGATIVE
High risk HPV: NEGATIVE

## 2019-09-07 ENCOUNTER — Ambulatory Visit (HOSPITAL_COMMUNITY): Payer: Self-pay

## 2019-09-07 ENCOUNTER — Ambulatory Visit (HOSPITAL_COMMUNITY)
Admission: RE | Admit: 2019-09-07 | Discharge: 2019-09-07 | Disposition: A | Discharge status: Home / Self Care | Payer: 59 | Source: Ambulatory Visit | Attending: Family Medicine | Admitting: Family Medicine

## 2019-09-07 ENCOUNTER — Other Ambulatory Visit: Payer: Self-pay

## 2019-09-07 DIAGNOSIS — Z1231 Encounter for screening mammogram for malignant neoplasm of breast: Secondary | ICD-10-CM | POA: Insufficient documentation

## 2019-09-14 ENCOUNTER — Other Ambulatory Visit: Payer: Self-pay

## 2019-09-14 ENCOUNTER — Ambulatory Visit (HOSPITAL_COMMUNITY)
Admission: RE | Admit: 2019-09-14 | Discharge: 2019-09-14 | Disposition: A | Discharge status: Home / Self Care | Payer: 59 | Source: Ambulatory Visit | Attending: Family Medicine | Admitting: Family Medicine

## 2019-09-14 DIAGNOSIS — Z1231 Encounter for screening mammogram for malignant neoplasm of breast: Secondary | ICD-10-CM | POA: Diagnosis present

## 2019-12-16 IMAGING — MG NEEDLE LOCALIZATION OF THE LEFT BREAST WITH MAMMO GUIDANCE
4 series · 4 of 4 positions shown · non-contrast
Comparison: Previous exam(s).

CLINICAL DATA: Patient has 2 recently diagnosed left breast complex
sclerosing lesions. She presents for radioactive seed localization
prior to surgical excision.

EXAM:
MAMMOGRAPHIC GUIDED RADIOACTIVE SEED LOCALIZATION OF THE LEFT BREAST

[L CC (1 of 3)]
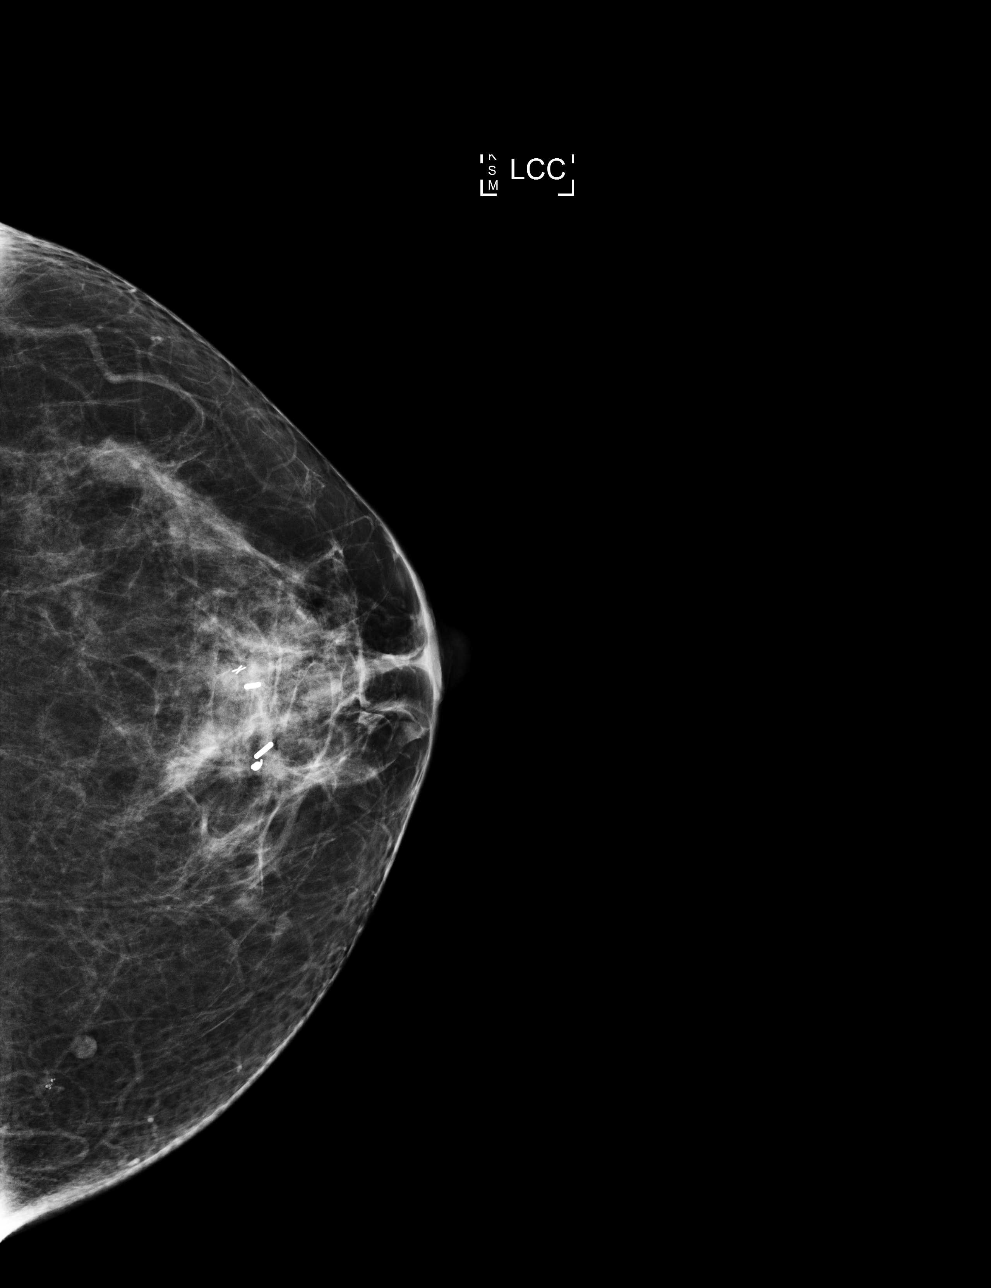

[L CC (2 of 3)]
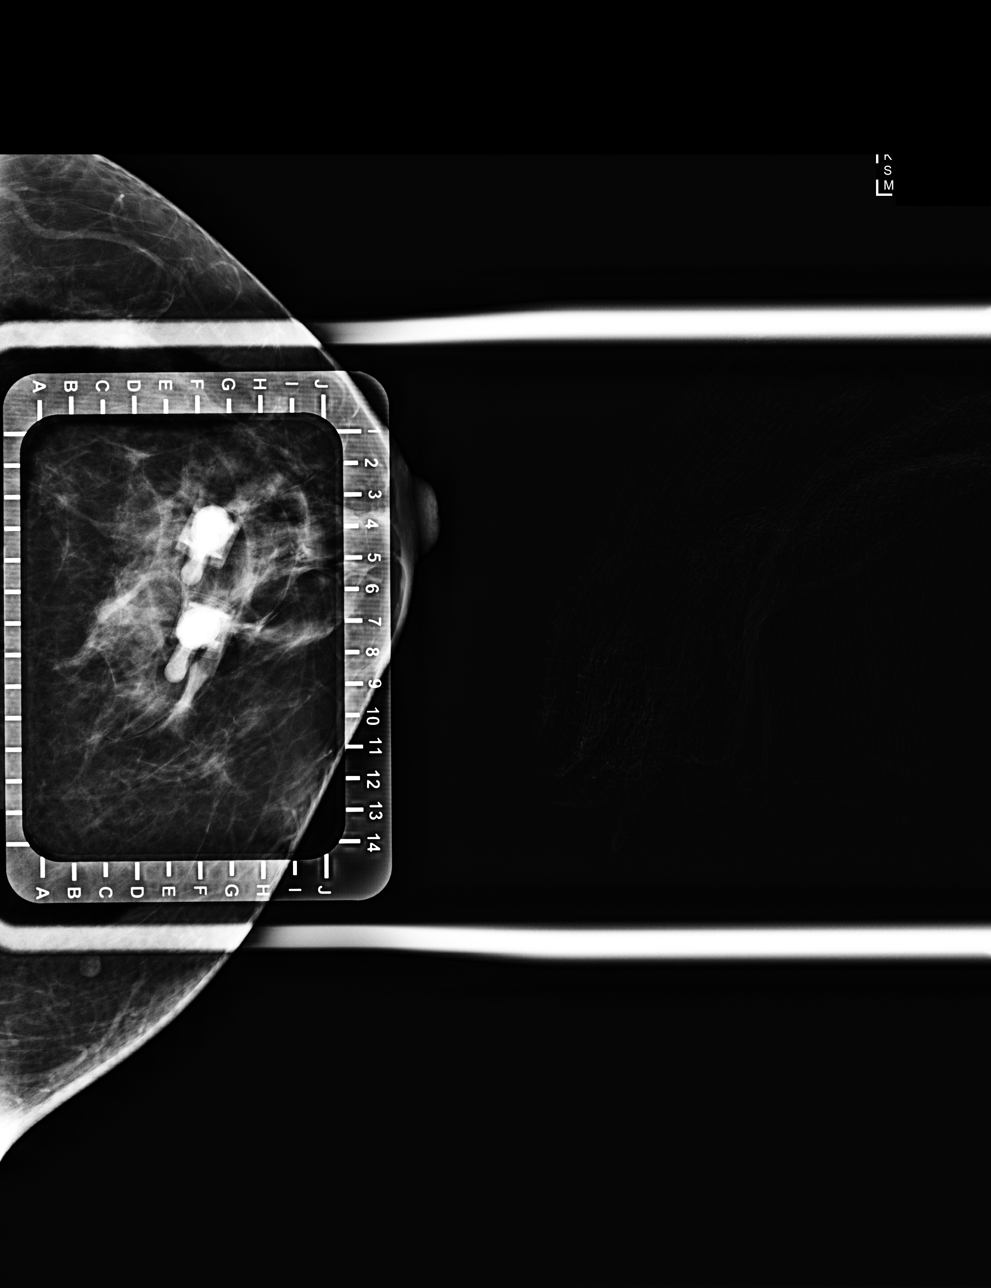

[L CC (3 of 3)]
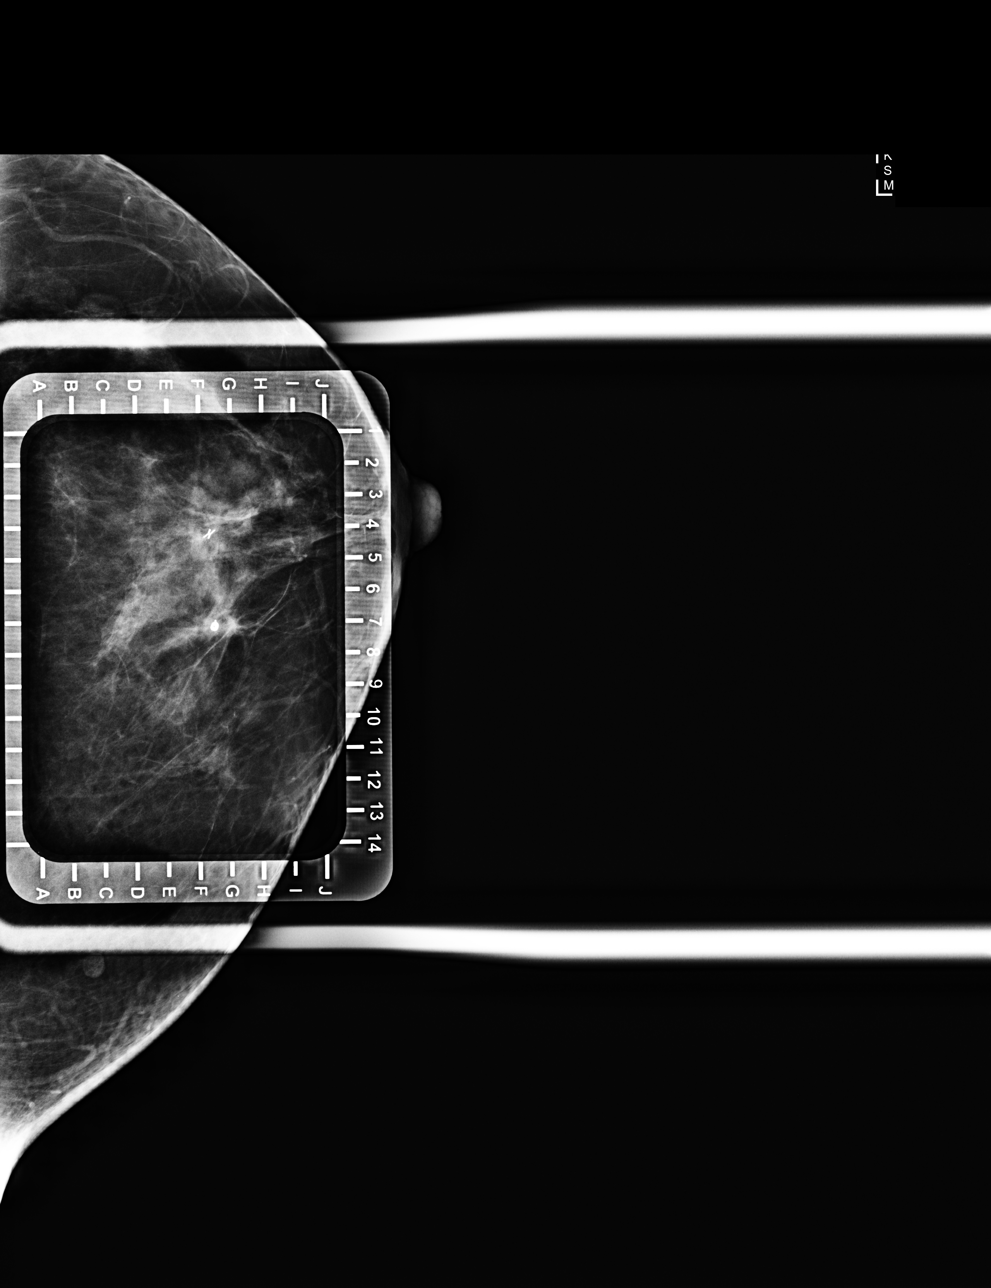

[L ML]
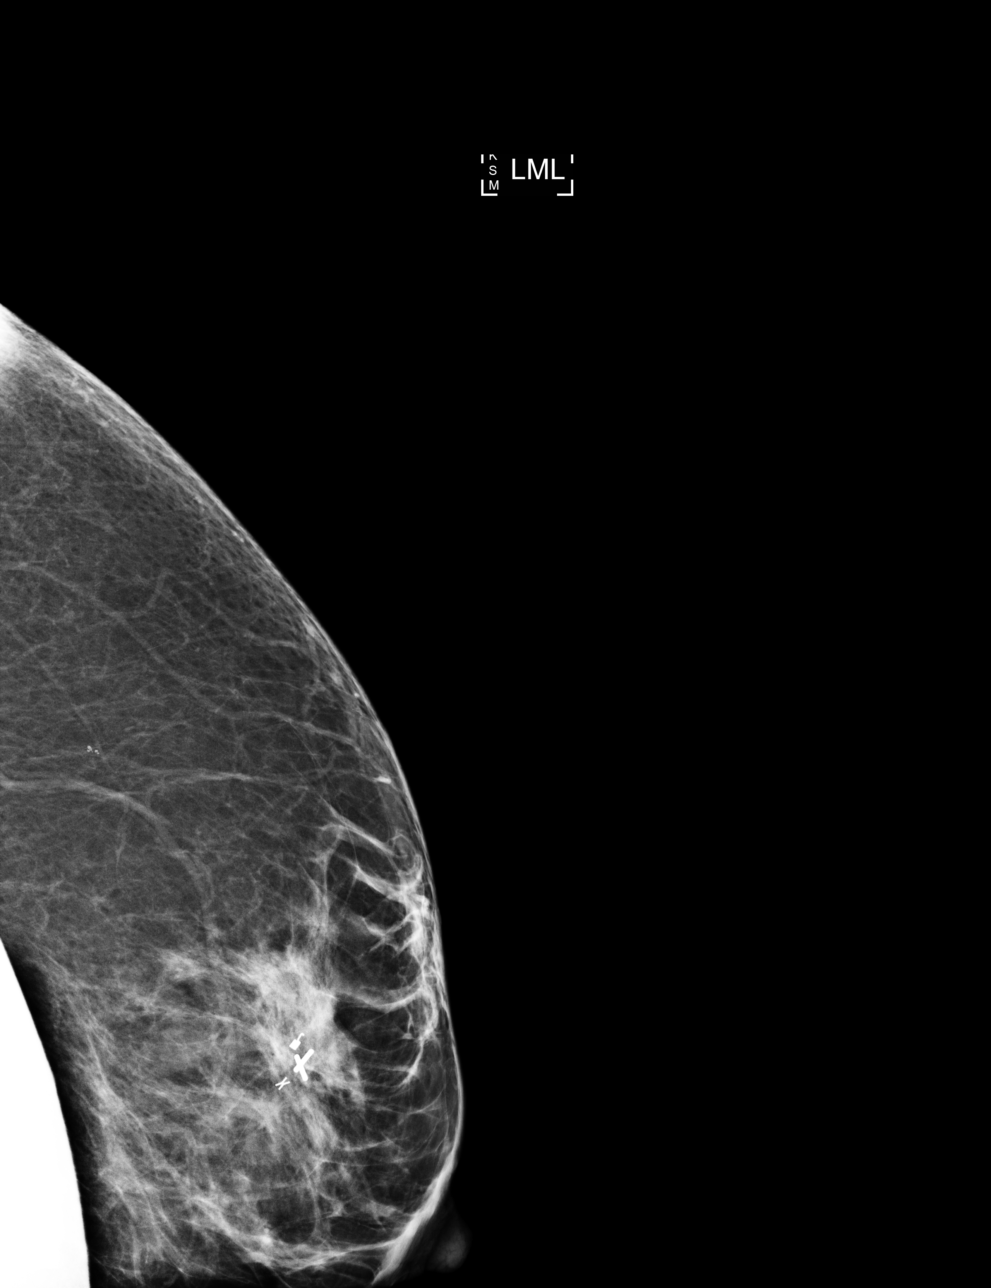

[4 of 4 positions shown; findings below may reference images not displayed]

FINDINGS: Patient presents for radioactive seed localization prior to surgical
excision of the 2 complex sclerosing left breast lesions. I met with
the patient and we discussed the procedure of seed localization
including benefits and alternatives. We discussed the high
likelihood of a successful procedure. We discussed the risks of the
procedure including infection, bleeding, tissue injury and further
surgery. We discussed the low dose of radioactivity involved in the
procedure. Informed, written consent was given.

The usual time-out protocol was performed immediately prior to the
procedure.

Using mammographic guidance, sterile technique, 1% lidocaine and an
V-EWY radioactive seed, the X shaped biopsy clip was localized using
a superior approach. The follow-up mammogram images confirm the seed
in the expected location and were marked for Dr. Compres.

Follow-up survey of the patient confirms presence of the radioactive
seed.

Order number of V-EWY seed:  [id number].

Total activity:  0.250 millicuries reference Date: 10/05/2018

Using mammographic guidance, sterile technique, 1% lidocaine and an
V-EWY radioactive seed, the coil shaped biopsy clip was localized
using a superior approach. The follow-up mammogram images confirm
the seed in the expected location and were marked for Dr. Compres.

Follow-up survey of the patient confirms presence of the radioactive
seed.

Order number of V-EWY seed:  [id number].

Total activity:  0.250 millicuries reference Date: 10/05/2018

The patient tolerated the procedure well and was released from the
[REDACTED]. She was given instructions regarding seed removal.
IMPRESSION: Radioactive seed localization 2 complex sclerosing lesions in the
left breast. No apparent complications.

## 2019-12-17 IMAGING — DX BREAST SURGICAL SPECIMEN
1 series · 2 of 2 positions shown · non-contrast
Comparison: Previous exam(s).

CLINICAL DATA: Status post excision of 2 complex sclerosing lesions
in left breast, both localized with radioactive seeds.

EXAM:
SPECIMEN RADIOGRAPH OF THE LEFT BREAST

[Series 2: specimen digital x-ray, derived · left · 2 of 2 slices shown]
[im 1/2]
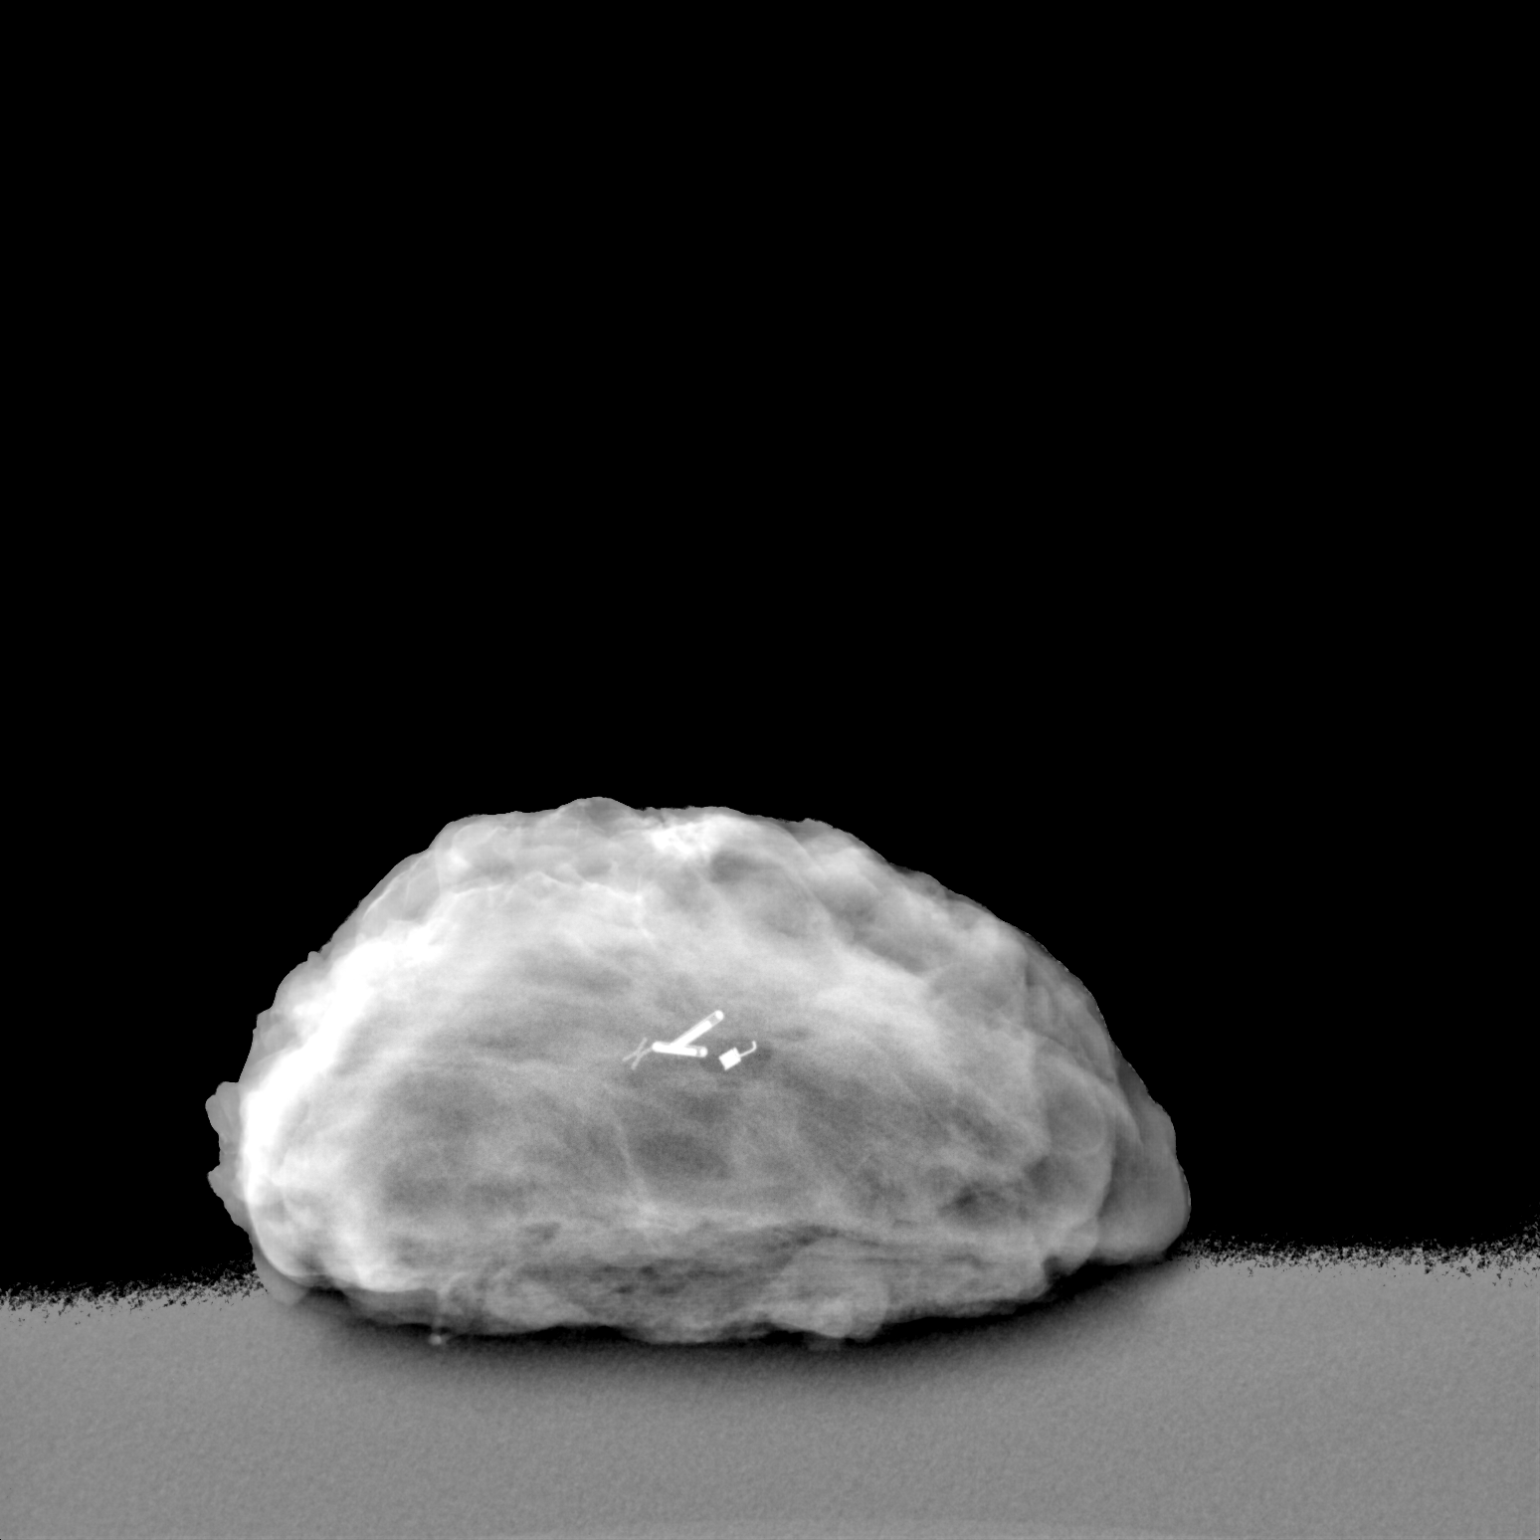
[im 2/2]
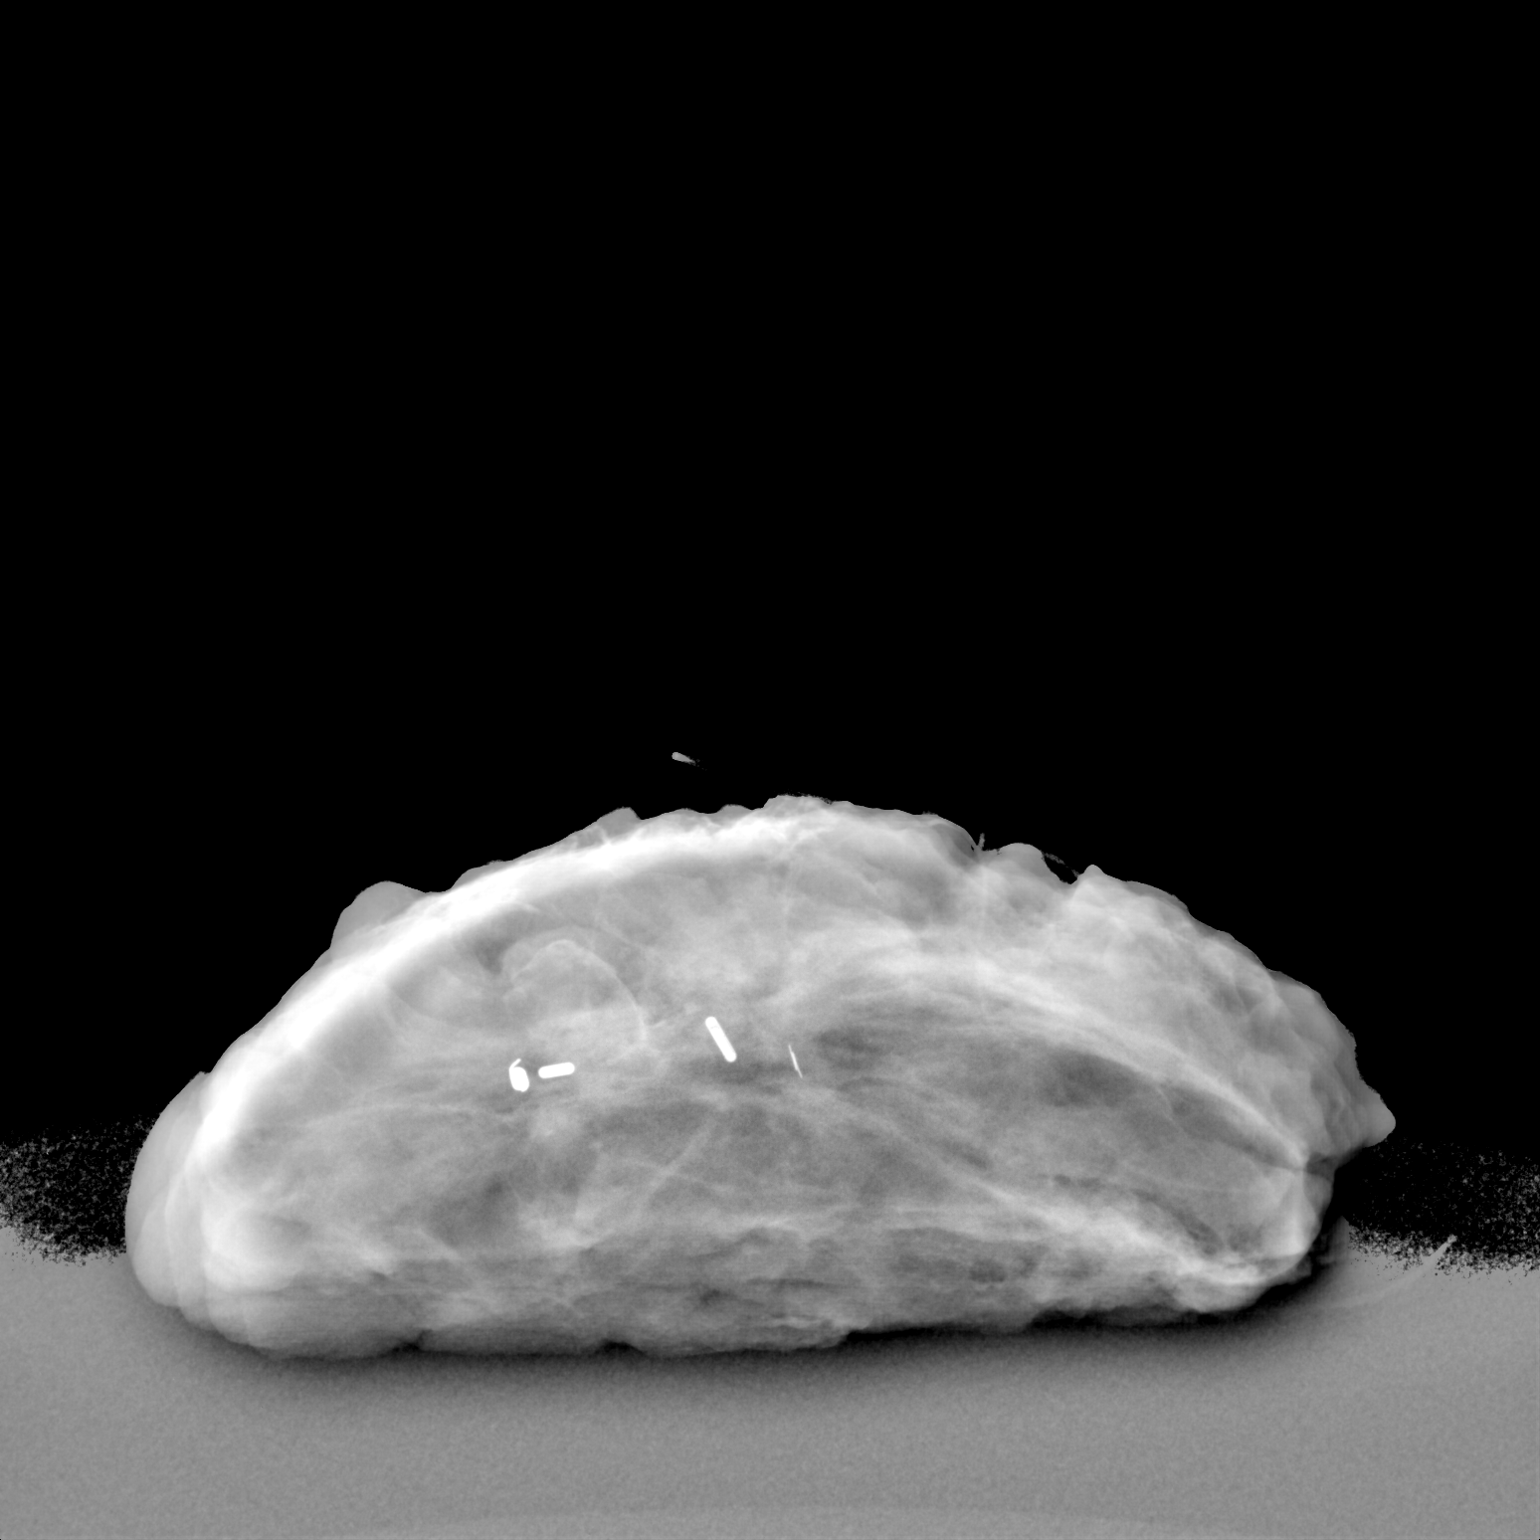

[2 of 2 positions shown; findings below may reference images not displayed]

FINDINGS: Status post excision of the left breast. Both biopsy clips and
radioactive seeds are present, completely intact, within the 1
specimen, and were marked for pathology.
IMPRESSION: Specimen radiograph of the left breast.

## 2020-01-05 ENCOUNTER — Other Ambulatory Visit: Payer: 59

## 2020-08-13 ENCOUNTER — Other Ambulatory Visit (HOSPITAL_COMMUNITY): Payer: Self-pay | Admitting: Internal Medicine

## 2020-08-13 DIAGNOSIS — Z1231 Encounter for screening mammogram for malignant neoplasm of breast: Secondary | ICD-10-CM

## 2020-08-27 ENCOUNTER — Ambulatory Visit (INDEPENDENT_AMBULATORY_CARE_PROVIDER_SITE_OTHER): Payer: 59 | Admitting: Adult Health

## 2020-08-27 ENCOUNTER — Encounter: Payer: Self-pay | Admitting: Adult Health

## 2020-08-27 ENCOUNTER — Other Ambulatory Visit: Payer: Self-pay

## 2020-08-27 VITALS — BP 137/82 | HR 69 | Ht 64.0 in | Wt 160.0 lb

## 2020-08-27 DIAGNOSIS — Z01419 Encounter for gynecological examination (general) (routine) without abnormal findings: Secondary | ICD-10-CM

## 2020-08-27 DIAGNOSIS — Z1211 Encounter for screening for malignant neoplasm of colon: Secondary | ICD-10-CM | POA: Diagnosis not present

## 2020-08-27 LAB — HEMOCCULT GUIAC POC 1CARD (OFFICE): Fecal Occult Blood, POC: NEGATIVE

## 2020-08-27 NOTE — Progress Notes (Signed)
Patient ID: Sarah May, female   DOB: September 28, 1960, 60 y.o.   MRN: [id number] History of Present Illness:  Sarah May is a 60 year old white female, single,PM in for a well woman gyn exam, she had a pap 08/25/19 normal with negative HR HPV. She is retired, but works PT at [employer]. PCP is Dr Gerarda Fraction.   Current Medications, Allergies, Past Medical History, Past Surgical History, Family History and Social History were reviewed in [facility] record.     Review of Systems:  Patient denies any headaches, hearing loss, fatigue, blurred vision, shortness of breath, chest pain, abdominal pain, problems with bowel movements, urination, or intercourse.(not currently active) No joint pain or mood swings.   Physical Exam:BP 137/82 (BP Location: Right Arm, Patient Position: Sitting, Cuff Size: Normal)   Pulse 69   Ht 5\' 4"  (1.626 m)   Wt 160 lb (72.6 kg)   LMP 06/10/2011   BMI 27.46 kg/m  General:  Well developed, well nourished, no acute distress Skin:  Warm and dry Neck:  Midline trachea, normal thyroid, good ROM, no lymphadenopathy Lungs; Clear to auscultation bilaterally Breast:  No dominant palpable mass, retraction, or nipple discharge Cardiovascular: Regular rate and rhythm Abdomen:  Soft, non tender, no hepatosplenomegaly Pelvic:  External genitalia is normal in appearance, no lesions.  The vagina is pale with loss of moisture ane rugae. Urethra has no lesions or masses. The cervix is bulbous.  Uterus is felt to be normal size, shape, and contour.  No adnexal masses or tenderness noted.Bladder is non tender, no masses felt. Rectal: Good sphincter tone, no polyps, or hemorrhoids felt.  Hemoccult negative. Extremities/musculoskeletal:  No swelling or varicosities noted, no clubbing or cyanosis Psych:  No mood changes, alert and cooperative,seems happy AA is 0  Fall risk is low PHQ 9 score is 0 GAD 7 score is 0  [facility] - 08/27/20 0837      Pregnancy Intention Screening    Does the patient want to become pregnant in the next year? No    Does the patient's partner want to become pregnant in the next year? No    Would the patient like to discuss contraceptive options today? No      Contraception Wrap Up   Current Method No Method - Other Reason   Post-menopausal   End Method No Method - Other Reason    Contraception Counseling Provided No         Examination chaperoned by angel RN  Impression and Plan: 1. Encounter for well woman exam with routine gynecological exam Physical in 1 year Pap in 2024 Labs with PCP Mammogram in June Colonoscopy per GI   2. Encounter for screening fecal occult blood testing

## 2020-09-19 ENCOUNTER — Ambulatory Visit (HOSPITAL_COMMUNITY): Payer: 59

## 2020-09-27 ENCOUNTER — Ambulatory Visit (HOSPITAL_COMMUNITY)
Admission: RE | Admit: 2020-09-27 | Discharge: 2020-09-27 | Disposition: A | Discharge status: Home / Self Care | Payer: 59 | Source: Ambulatory Visit | Attending: Internal Medicine | Admitting: Internal Medicine

## 2020-09-27 ENCOUNTER — Other Ambulatory Visit: Payer: Self-pay

## 2020-09-27 DIAGNOSIS — Z1231 Encounter for screening mammogram for malignant neoplasm of breast: Secondary | ICD-10-CM

## 2021-06-18 ENCOUNTER — Encounter: Payer: Self-pay | Admitting: Gastroenterology

## 2021-06-27 ENCOUNTER — Encounter: Payer: Self-pay | Admitting: Gastroenterology

## 2021-07-17 ENCOUNTER — Other Ambulatory Visit: Payer: Self-pay

## 2021-07-17 ENCOUNTER — Ambulatory Visit (AMBULATORY_SURGERY_CENTER): Payer: Self-pay | Admitting: *Deleted

## 2021-07-17 VITALS — Ht 64.0 in | Wt 160.0 lb

## 2021-07-17 DIAGNOSIS — Z1211 Encounter for screening for malignant neoplasm of colon: Secondary | ICD-10-CM

## 2021-07-17 MED ORDER — PEG 3350-KCL-NA BICARB-NACL 420 G PO SOLR: 4000.0000 mL | Freq: Once | ORAL | 0 refills | Status: AC

## 2021-07-17 NOTE — Progress Notes (Signed)
Patient's pre-visit was done today over the phone with the patient. Name,DOB and address verified. Patient denies any allergies to Eggs and Soy. Patient denies any problems with anesthesia/sedation. Patient is not taking any diet pills or blood thinners. No home Oxygen. Insurance card scan into epic per pt. ? ?Prep instructions sent to pt's MyChart-pt aware. Patient understands to call us back with any questions or concerns. Patient is aware of our care-partner policy and XLKGM-01 safety protocol.  ? ?EMMI education assigned to the patient for the procedure, sent to [facility].  ? ?The patient is COVID-19 vaccinated.   ?

## 2021-07-29 ENCOUNTER — Other Ambulatory Visit (HOSPITAL_COMMUNITY): Payer: Self-pay | Admitting: Internal Medicine

## 2021-07-29 DIAGNOSIS — Z1231 Encounter for screening mammogram for malignant neoplasm of breast: Secondary | ICD-10-CM

## 2021-08-01 ENCOUNTER — Telehealth: Payer: Self-pay | Admitting: Gastroenterology

## 2021-08-01 NOTE — Telephone Encounter (Signed)
Inbound call from patient reports she have been experiencing diarrhea for 3 days now. Don't know if it is a stomach bug. Would like to know if this is fine before upcoming procedure 4/19 ?

## 2021-08-01 NOTE — Telephone Encounter (Signed)
Pt states has had diarrhea the last few days , no fever, no blood, no vomiting- colon next WED 4-19- instructed to monitor her S/s and any changes call us by Monday - if diarrhea stops ok for 4-19 colon- pt verbalized understanding  ?

## 2021-08-01 NOTE — Telephone Encounter (Signed)
Called pt- no answer- LM to return call  marie PV  ?

## 2021-08-07 ENCOUNTER — Ambulatory Visit (AMBULATORY_SURGERY_CENTER): Payer: 59 | Admitting: Gastroenterology

## 2021-08-07 ENCOUNTER — Encounter: Payer: Self-pay | Admitting: Gastroenterology

## 2021-08-07 VITALS — BP 110/86 | HR 66 | Temp 98.7°F | Resp 13 | Ht 64.0 in | Wt 160.0 lb

## 2021-08-07 DIAGNOSIS — D122 Benign neoplasm of ascending colon: Secondary | ICD-10-CM | POA: Diagnosis not present

## 2021-08-07 DIAGNOSIS — Z1211 Encounter for screening for malignant neoplasm of colon: Secondary | ICD-10-CM

## 2021-08-07 MED ORDER — SODIUM CHLORIDE 0.9 % IV SOLN: 500.0000 mL | INTRAVENOUS | Status: DC

## 2021-08-07 NOTE — Progress Notes (Signed)
Called to room to assist during endoscopic procedure.  Patient ID and intended procedure confirmed with present staff. Received instructions for my participation in the procedure from the performing physician.  

## 2021-08-07 NOTE — Progress Notes (Signed)
Sedate, gd SR, tolerated procedure well, VSS, report to RN 

## 2021-08-07 NOTE — Progress Notes (Signed)
Pt's states no medical or surgical changes since previsit or office visit. 

## 2021-08-07 NOTE — Op Note (Signed)
[facility] ?Patient Name: Sarah May ?Procedure Date: 08/07/2021 1:53 PM ?MRN: [id number] ?Endoscopist: Mauri Pole , MD ?Age: 61 ?Referring MD:  ?Date of Birth: 1961-03-06 ?Gender: Female ?Account #: [id number] ?Procedure:                Colonoscopy ?Indications:              Screening for colorectal malignant neoplasm ?Medicines:                Monitored Anesthesia Care ?Procedure:                Pre-Anesthesia Assessment: ?                          - Prior to the procedure, a History and Physical  ?                          was performed, and patient medications and  ?                          allergies were reviewed. The patient's tolerance of  ?                          previous anesthesia was also reviewed. The risks  ?                          and benefits of the procedure and the sedation  ?                          options and risks were discussed with the patient.  ?                          All questions were answered, and informed consent  ?                          was obtained. Prior Anticoagulants: The patient has  ?                          taken no previous anticoagulant or antiplatelet  ?                          agents. ASA Grade Assessment: II - A patient with  ?                          mild systemic disease. After reviewing the risks  ?                          and benefits, the patient was deemed in  ?                          satisfactory condition to undergo the procedure. ?                          After obtaining informed consent, the colonoscope  ?  was passed under direct vision. Throughout the  ?                          procedure, the patient's blood pressure, pulse, and  ?                          oxygen saturations were monitored continuously. The  ?                          PCF-HQ190L Colonoscope was introduced through the  ?                          anus and advanced to the the cecum, identified by  ?                          appendiceal  orifice and ileocecal valve. The  ?                          colonoscopy was performed without difficulty. The  ?                          patient tolerated the procedure well. The quality  ?                          of the bowel preparation was excellent. The  ?                          ileocecal valve, appendiceal orifice, and rectum  ?                          were photographed. ?Scope In: 2:05:19 PM ?Scope Out: 2:19:58 PM ?Scope Withdrawal Time: 0 hours 10 minutes 38 seconds  ?Total Procedure Duration: 0 hours 14 minutes 39 seconds  ?Findings:                 The perianal and digital rectal examinations were  ?                          normal. ?                          A 12 mm polyp was found in the ascending colon. The  ?                          polyp was semi-pedunculated. The polyp was removed  ?                          with a hot snare. Resection and retrieval were  ?                          complete. ?                          Scattered small and large-mouthed diverticula were  ?  found in the sigmoid colon. ?                          Non-bleeding external and internal hemorrhoids were  ?                          found during retroflexion. The hemorrhoids were  ?                          medium-sized. ?Complications:            No immediate complications. ?Estimated Blood Loss:     Estimated blood loss was minimal. ?Impression:               - One 12 mm polyp in the ascending colon, removed  ?                          with a hot snare. Resected and retrieved. ?                          - Diverticulosis in the sigmoid colon. ?                          - Non-bleeding external and internal hemorrhoids. ?Recommendation:           - Patient has a contact number available for  ?                          emergencies. The signs and symptoms of potential  ?                          delayed complications were discussed with the  ?                          patient. Return to normal  activities tomorrow.  ?                          Written discharge instructions were provided to the  ?                          patient. ?                          - Resume previous diet. ?                          - Continue present medications. ?                          - Await pathology results. ?                          - Repeat colonoscopy in 3 - 10 years for  ?                          surveillance based on pathology results. ?Mauri Pole, MD ?08/07/2021 2:23:58 PM ?This report has been signed electronically. ?

## 2021-08-07 NOTE — Progress Notes (Signed)
[facility] History and Physical ? ? ?Primary Care Physician:  Redmond School, MD ? ? ?Reason for Procedure:  Colorectal cancer screening ? ?Plan:    Screening colonoscopy with possible interventions as needed ? ? ? ? ?HPI: Sarah May is a very pleasant 61 y.o. female here for screening colonoscopy. ?Denies any nausea, vomiting, abdominal pain, melena or bright red blood per rectum ? ?The risks and benefits as well as alternatives of endoscopic procedure(s) have been discussed and reviewed. All questions answered. The patient agrees to proceed. ? ? ? ?Past Medical History:  ?Diagnosis Date  ? Abnormality of left breast on screening mammogram 10/29/2018  ? Anemia   ? Burning with urination 05/10/2015  ? Constipation 05/08/2014  ? Stress 05/08/2014  ? UTI (lower urinary tract infection) 05/10/2015  ? ? ?Past Surgical History:  ?Procedure Laterality Date  ? BREAST LUMPECTOMY WITH RADIOACTIVE SEED LOCALIZATION Left 10/29/2018  ? Procedure: LEFT BREAST LUMPECTOMY X2 WITH RADIOACTIVE SEED LOCALIZATION X2;  Surgeon: Fanny Skates, MD;  Location: [facility];  Service: General;  Laterality: Left;  ? COLONOSCOPY  04/01/2011  ? Dr.Brodie  ? ? ?Prior to Admission medications   ?Medication Sig Start Date End Date Taking? Authorizing Provider  ?cholecalciferol (VITAMIN D3) 25 MCG (1000 UNIT) tablet Take 1,000 Units by mouth daily.   Yes [provider]  ?polyethylene glycol (MIRALAX / GLYCOLAX) 17 g packet Take 17 g by mouth daily.   Yes [provider]  ?sertraline (ZOLOFT) 25 MG tablet Take 25 mg by mouth daily. 07/07/21  Yes [provider]  ? ? ?Current Outpatient Medications  ?Medication Sig Dispense Refill  ? cholecalciferol (VITAMIN D3) 25 MCG (1000 UNIT) tablet Take 1,000 Units by mouth daily.    ? polyethylene glycol (MIRALAX / GLYCOLAX) 17 g packet Take 17 g by mouth daily.    ? sertraline (ZOLOFT) 25 MG tablet Take 25 mg by mouth daily.    ? ?Current  Facility-Administered Medications  ?Medication Dose Route Frequency Provider Last Rate Last Admin  ? 0.9 %  sodium chloride infusion  500 mL Intravenous Continuous Kayli Beal, Venia Minks, MD      ? ? ?Allergies as of 08/07/2021  ? (No Known Allergies)  ? ? ?Family History  ?Problem Relation Age of Onset  ? Dementia Mother   ? Congestive Heart Failure Father   ? Breast cancer Sister   ? Down syndrome Brother   ? Breast cancer Maternal Aunt   ? Dementia Maternal Grandmother   ? Cancer Paternal Grandmother   ? Cancer Paternal Grandfather   ? Colon cancer Neg Hx   ? Colon polyps Neg Hx   ? ? ?Social History  ? ?Socioeconomic History  ? Marital status: Single  ?  Spouse name: Not on file  ? Number of children: 0  ? Years of education: Not on file  ? Highest education level: Not on file  ?Occupational History  ? Not on file  ?Tobacco Use  ? Smoking status: Never  ? Smokeless tobacco: Never  ?Vaping Use  ? Vaping Use: Never used  ?Substance and Sexual Activity  ? Alcohol use: No  ? Drug use: No  ? Sexual activity: Not Currently  ?  Birth control/protection: Post-menopausal  ?Other Topics Concern  ? Not on file  ?Social History Narrative  ? Not on file  ? ?Social Determinants of Health  ? ?Financial Resource Strain: Low Risk   ? Difficulty of Paying Living Expenses: Not hard at  all  ?Food Insecurity: No Food Insecurity  ? Worried About Charity fundraiser in the Last Year: Never true  ? Ran Out of Food in the Last Year: Never true  ?Transportation Needs: No Transportation Needs  ? Lack of Transportation (Medical): No  ? Lack of Transportation (Non-Medical): No  ?Physical Activity: Insufficiently Active  ? Days of Exercise per Week: 2 days  ? Minutes of Exercise per Session: 20 min  ?Stress: No Stress Concern Present  ? Feeling of Stress : Only a little  ?Social Connections: Moderately Integrated  ? Frequency of Communication with Friends and Family: Three times a week  ? Frequency of Social Gatherings with Friends and Family:  Three times a week  ? Attends Religious Services: More than 4 times per year  ? Active Member of Clubs or Organizations: Yes  ? Attends Archivist Meetings: More than 4 times per year  ? Marital Status: Never married  ?Intimate Partner Violence: Not At Risk  ? Fear of Current or Ex-Partner: No  ? Emotionally Abused: No  ? Physically Abused: No  ? Sexually Abused: No  ? ? ?Review of Systems: ? ?All other review of systems negative except as mentioned in the HPI. ? ?Physical Exam: ?Vital signs in last 24 hours: ?BP 126/67   Pulse 70   Temp 98.7 ?F (37.1 ?C) (Temporal)   Ht '5\' 4"'$  (1.626 m)   Wt 160 lb (72.6 kg)   LMP 06/10/2011   SpO2 98%   BMI 27.46 kg/m?  ?General:   Alert, NAD ?Lungs:  Clear .   ?Heart:  Regular rate and rhythm ?Abdomen:  Soft, nontender and nondistended. ?Neuro/Psych:  Alert and cooperative. Normal mood and affect. A and O x 3 ? ?Reviewed labs, radiology imaging, old records and pertinent past GI work up ? ?Patient is appropriate for planned procedure(s) and anesthesia in an ambulatory setting ? ? ?K. Denzil Magnuson , MD ?[phone]  ? ? ?  ?

## 2021-08-07 NOTE — Patient Instructions (Signed)
Handouts on hemorrhoids, diverticulosis, and polyps given to you today  ? ? ?YOU HAD AN ENDOSCOPIC PROCEDURE TODAY AT [facility]:   Refer to the procedure report that was given to you for any specific questions about what was found during the examination.  If the procedure report does not answer your questions, please call your gastroenterologist to clarify.  If you requested that your care partner not be given the details of your procedure findings, then the procedure report has been included in a sealed envelope for you to review at your convenience later. ? ?YOU SHOULD EXPECT: Some feelings of bloating in the abdomen. Passage of more gas than usual.  Walking can help get rid of the air that was put into your GI tract during the procedure and reduce the bloating. If you had a lower endoscopy (such as a colonoscopy or flexible sigmoidoscopy) you may notice spotting of blood in your stool or on the toilet paper. If you underwent a bowel prep for your procedure, you may not have a normal bowel movement for a few days. ? ?Please Note:  You might notice some irritation and congestion in your nose or some drainage.  This is from the oxygen used during your procedure.  There is no need for concern and it should clear up in a day or so. ? ?SYMPTOMS TO REPORT IMMEDIATELY: ? ?Following lower endoscopy (colonoscopy or flexible sigmoidoscopy): ? Excessive amounts of blood in the stool ? Significant tenderness or worsening of abdominal pains ? Swelling of the abdomen that is new, acute ? Fever of 100?F or higher ? ?For urgent or emergent issues, a gastroenterologist can be reached at any hour by calling [phone]. ?Do not use MyChart messaging for urgent concerns.  ? ? ?DIET:  We do recommend a small meal at first, but then you may proceed to your regular diet.  Drink plenty of fluids but you should avoid alcoholic beverages for 24 hours. ? ?ACTIVITY:  You should plan to take it easy for the rest of  today and you should NOT DRIVE or use heavy machinery until tomorrow (because of the sedation medicines used during the test).   ? ?FOLLOW UP: ?Our staff will call the number listed on your records 48-72 hours following your procedure to check on you and address any questions or concerns that you may have regarding the information given to you following your procedure. If we do not reach you, we will leave a message.  We will attempt to reach you two times.  During this call, we will ask if you have developed any symptoms of COVID 19. If you develop any symptoms (ie: fever, flu-like symptoms, shortness of breath, cough etc.) before then, please call [phone].  If you test positive for Covid 19 in the 2 weeks post procedure, please call and report this information to [place].   ? ?If any biopsies were taken you will be contacted by phone or by letter within the next 1-3 weeks.  Please call us at [phone] if you have not heard about the biopsies in 3 weeks.  ? ? ?SIGNATURES/CONFIDENTIALITY: ?You and/or your care partner have signed paperwork which will be entered into your electronic medical record.  These signatures attest to the fact that that the information above on your After Visit Summary has been reviewed and is understood.  Full responsibility of the confidentiality of this discharge information lies with you and/or your care-partner.  ?

## 2021-08-09 ENCOUNTER — Telehealth: Payer: Self-pay

## 2021-08-09 NOTE — Telephone Encounter (Signed)
Left message on follow up call. 

## 2021-08-09 NOTE — Telephone Encounter (Signed)
Second attempt follow up call to pt, lm for pt on VM ?

## 2021-08-16 ENCOUNTER — Encounter: Payer: Self-pay | Admitting: Gastroenterology

## 2021-08-28 ENCOUNTER — Ambulatory Visit (INDEPENDENT_AMBULATORY_CARE_PROVIDER_SITE_OTHER): Payer: 59 | Admitting: Adult Health

## 2021-08-28 ENCOUNTER — Encounter: Payer: Self-pay | Admitting: Adult Health

## 2021-08-28 VITALS — BP 122/85 | HR 72 | Ht 64.0 in | Wt 155.0 lb

## 2021-08-28 DIAGNOSIS — Z1211 Encounter for screening for malignant neoplasm of colon: Secondary | ICD-10-CM | POA: Diagnosis not present

## 2021-08-28 DIAGNOSIS — Z01419 Encounter for gynecological examination (general) (routine) without abnormal findings: Secondary | ICD-10-CM

## 2021-08-28 LAB — HEMOCCULT GUIAC POC 1CARD (OFFICE): Fecal Occult Blood, POC: NEGATIVE

## 2021-08-28 NOTE — Progress Notes (Signed)
Patient ID: Sarah May, female   DOB: May 05, 1960, 61 y.o.   MRN: [id number] ?History of Present Illness: ?Sarah May is a 61 year old white female,single, PM in for gyn exam. She had physical and labs 08/23/21 with Delman Cheadle PA ? ?Lab Results  ?Component Value Date  ? DIAGPAP  08/25/2019  ?  - Negative for intraepithelial lesion or malignancy (NILM)  ? [place] Negative 08/25/2019  ?  ? ? ?Current Medications, Allergies, Past Medical History, Past Surgical History, Family History and Social History were reviewed in [employer] record.   ? ? ?Review of Systems: ?Patient denies any headaches, hearing loss, fatigue, blurred vision, shortness of breath, chest pain, abdominal pain, problems with bowel movements, urination, or intercourse(not having sex). No joint pain or mood swings.  ?Denies any vaginal bleeding ? ? ?Physical Exam:BP 122/85 (BP Location: Right Arm, Patient Position: Sitting, Cuff Size: Normal)   Pulse 72   Ht '5\' 4"'$  (1.626 m)   Wt 155 lb (70.3 kg)   LMP 06/10/2011   BMI 26.61 kg/m?   ?General:  Well developed, well nourished, no acute distress ?Skin:  Warm and dry ?Neck:  Midline trachea, normal thyroid, good ROM, no lymphadenopathy,no carotid bruits heard  ?Lungs; Clear to auscultation bilaterally ?Breast:  No dominant palpable mass, retraction, or nipple discharge ?Cardiovascular: Regular rate and rhythm ?Abdomen:  Soft, non tender, no hepatosplenomegaly ?Pelvic:  External genitalia is normal in appearance, no lesions.  The vagina is pale with loss of rugae and moisture. Urethra has no lesions or masses. The cervix is smooth.  Uterus is felt to be normal size, shape, and contour.  No adnexal masses or tenderness noted.Bladder is non tender, no masses felt. ?Rectal: Good sphincter tone, no polyps, or hemorrhoids felt.  Hemoccult negative. ?Extremities/musculoskeletal:  No swelling or varicosities noted, no clubbing or cyanosis ?Psych:  No mood changes, alert and  cooperative,seems happy ?Reviewed labs from PCP with her, glucose 125 fasting, CBC good, kidney and liver function normal, total cholesterol and LDL slightly elevated, HDL good at 67 and triglycerides 36, will scan to chart  ?AA is 0 ?Fall risk is low ? ?  08/28/2021  ?  8:37 AM 08/27/2020  ?  8:37 AM 08/25/2019  ? 10:27 AM  ?Depression screen PHQ 2/9  ?Decreased Interest 0 0 0  ?Down, Depressed, Hopeless 0 0 0  ?PHQ - 2 Score 0 0 0  ?Altered sleeping 1 0 0  ?Tired, decreased energy 0 0 0  ?Change in appetite 0 0 0  ?Feeling bad or failure about yourself  0 0 0  ?Trouble concentrating 0 0 0  ?Moving slowly or fidgety/restless 0 0 0  ?Suicidal thoughts 0 0 0  ?PHQ-9 Score 1 0 0  ?  ? ?  08/28/2021  ?  8:37 AM 08/27/2020  ?  8:37 AM 08/25/2019  ? 10:28 AM  ?GAD 7 : Generalized Anxiety Score  ?Nervous, Anxious, on Edge 1 0 0  ?Control/stop worrying 0 0 0  ?Worry too much - different things 1 0 0  ?Trouble relaxing 1 0 0  ?Restless 0 0 0  ?Easily annoyed or irritable 0 0 0  ?Afraid - awful might happen 0 0 0  ?Total GAD 7 Score 3 0 0  ? ?  ? Upstream - 08/28/21 0843   ? ?  ? Pregnancy Intention Screening  ? Does the patient want to become pregnant in the next year? No   ? Does the patient's  partner want to become pregnant in the next year? No   ? Would the patient like to discuss contraceptive options today? No   ?  ? Contraception Wrap Up  ? Current Method Abstinence   ? End Method Abstinence   ? Contraception Counseling Provided No   ? ?  ?  ? ?  ?  ?Examination chaperoned by Celene Squibb LPN ? ?Impression and Plan: ?1. Encounter for gynecological examination ?Pap and gyn exam in 1 year ?Labs with PCP ?Mammograms yearly ?Colonoscopy per GI ? ? ?2. Encounter for screening fecal occult blood testing ?Hemoccult negative  ? ? ? ?  ?  ?

## 2021-09-30 ENCOUNTER — Ambulatory Visit (HOSPITAL_COMMUNITY)
Admission: RE | Admit: 2021-09-30 | Discharge: 2021-09-30 | Disposition: A | Discharge status: Home / Self Care | Payer: 59 | Source: Ambulatory Visit | Attending: Internal Medicine | Admitting: Internal Medicine

## 2021-09-30 DIAGNOSIS — Z1231 Encounter for screening mammogram for malignant neoplasm of breast: Secondary | ICD-10-CM | POA: Diagnosis present

## 2022-03-10 DIAGNOSIS — E663 Overweight: Secondary | ICD-10-CM | POA: Diagnosis not present

## 2022-03-10 DIAGNOSIS — J069 Acute upper respiratory infection, unspecified: Secondary | ICD-10-CM | POA: Diagnosis not present

## 2022-05-07 DIAGNOSIS — D0371 Melanoma in situ of right lower limb, including hip: Secondary | ICD-10-CM | POA: Diagnosis not present

## 2022-05-07 DIAGNOSIS — D2271 Melanocytic nevi of right lower limb, including hip: Secondary | ICD-10-CM | POA: Diagnosis not present

## 2022-05-21 DIAGNOSIS — M546 Pain in thoracic spine: Secondary | ICD-10-CM | POA: Diagnosis not present

## 2022-05-21 DIAGNOSIS — M9902 Segmental and somatic dysfunction of thoracic region: Secondary | ICD-10-CM | POA: Diagnosis not present

## 2022-05-21 DIAGNOSIS — M9903 Segmental and somatic dysfunction of lumbar region: Secondary | ICD-10-CM | POA: Diagnosis not present

## 2022-05-21 DIAGNOSIS — M62830 Muscle spasm of back: Secondary | ICD-10-CM | POA: Diagnosis not present

## 2022-05-21 DIAGNOSIS — M9905 Segmental and somatic dysfunction of pelvic region: Secondary | ICD-10-CM | POA: Diagnosis not present

## 2022-05-21 DIAGNOSIS — L988 Other specified disorders of the skin and subcutaneous tissue: Secondary | ICD-10-CM | POA: Diagnosis not present

## 2022-05-22 DIAGNOSIS — R052 Subacute cough: Secondary | ICD-10-CM | POA: Diagnosis not present

## 2022-05-22 DIAGNOSIS — K219 Gastro-esophageal reflux disease without esophagitis: Secondary | ICD-10-CM | POA: Diagnosis not present

## 2022-05-22 DIAGNOSIS — Z6826 Body mass index (BMI) 26.0-26.9, adult: Secondary | ICD-10-CM | POA: Diagnosis not present

## 2022-05-22 DIAGNOSIS — E663 Overweight: Secondary | ICD-10-CM | POA: Diagnosis not present

## 2022-06-06 DIAGNOSIS — U071 COVID-19: Secondary | ICD-10-CM | POA: Diagnosis not present

## 2022-07-23 ENCOUNTER — Other Ambulatory Visit (HOSPITAL_COMMUNITY): Payer: Self-pay | Admitting: Internal Medicine

## 2022-07-23 DIAGNOSIS — Z1231 Encounter for screening mammogram for malignant neoplasm of breast: Secondary | ICD-10-CM

## 2022-07-31 DIAGNOSIS — H40013 Open angle with borderline findings, low risk, bilateral: Secondary | ICD-10-CM | POA: Diagnosis not present

## 2022-08-28 DIAGNOSIS — R7309 Other abnormal glucose: Secondary | ICD-10-CM | POA: Diagnosis not present

## 2022-08-28 DIAGNOSIS — Z0001 Encounter for general adult medical examination with abnormal findings: Secondary | ICD-10-CM | POA: Diagnosis not present

## 2022-08-28 DIAGNOSIS — E663 Overweight: Secondary | ICD-10-CM | POA: Diagnosis not present

## 2022-08-28 DIAGNOSIS — Z1331 Encounter for screening for depression: Secondary | ICD-10-CM | POA: Diagnosis not present

## 2022-08-28 DIAGNOSIS — K219 Gastro-esophageal reflux disease without esophagitis: Secondary | ICD-10-CM | POA: Diagnosis not present

## 2022-08-28 DIAGNOSIS — F419 Anxiety disorder, unspecified: Secondary | ICD-10-CM | POA: Diagnosis not present

## 2022-08-28 DIAGNOSIS — Z6827 Body mass index (BMI) 27.0-27.9, adult: Secondary | ICD-10-CM | POA: Diagnosis not present

## 2022-09-11 ENCOUNTER — Ambulatory Visit (INDEPENDENT_AMBULATORY_CARE_PROVIDER_SITE_OTHER): Payer: 59 | Admitting: Adult Health

## 2022-09-11 ENCOUNTER — Other Ambulatory Visit (HOSPITAL_COMMUNITY)
Admission: RE | Admit: 2022-09-11 | Discharge: 2022-09-11 | Disposition: A | Discharge status: Home / Self Care | Payer: 59 | Source: Ambulatory Visit | Attending: Adult Health | Admitting: Adult Health

## 2022-09-11 ENCOUNTER — Encounter: Payer: Self-pay | Admitting: Adult Health

## 2022-09-11 VITALS — BP 133/85 | HR 87 | Ht 64.0 in | Wt 168.0 lb

## 2022-09-11 DIAGNOSIS — Z1211 Encounter for screening for malignant neoplasm of colon: Secondary | ICD-10-CM

## 2022-09-11 DIAGNOSIS — Z01419 Encounter for gynecological examination (general) (routine) without abnormal findings: Secondary | ICD-10-CM | POA: Diagnosis not present

## 2022-09-11 LAB — HEMOCCULT GUIAC POC 1CARD (OFFICE): Fecal Occult Blood, POC: NEGATIVE

## 2022-09-11 NOTE — Progress Notes (Signed)
Patient ID: Sarah May, female   DOB: 1960-05-29, 62 y.o.   MRN: [id number] History of Present Illness: Sarah May is a 62 year old white female, single, PM in for a well woman gyn exam and pap. She is working 2 days a week at [employer].   PCP is Dr Sherwood Gambler.  Current Medications, Allergies, Past Medical History, Past Surgical History, Family History and Social History were reviewed in [employer] electronic medical record.     Review of Systems: Patient denies any headaches, hearing loss, fatigue, blurred vision, shortness of breath, chest pain, abdominal pain, problems with bowel movements, urination, or intercourse(not active). No joint pain or mood swings.     Physical Exam:BP 133/85 (BP Location: Left Arm, Patient Position: Sitting, Cuff Size: Normal)   Pulse 87   Ht 5\' 4"  (1.626 m)   Wt 168 lb (76.2 kg)   LMP 06/10/2011   BMI 28.84 kg/m   General:  Well developed, well nourished, no acute distress Skin:  Warm and dry Neck:  Midline trachea, normal thyroid, good ROM, no lymphadenopathy,no carotid bruits heard  Lungs; Clear to auscultation bilaterally Breast:  No dominant palpable mass, retraction, or nipple discharge Cardiovascular: Regular rate and rhythm Abdomen:  Soft, non tender, no hepatosplenomegaly Pelvic:  External genitalia is normal in appearance, no lesions.  The vagina is pale and atrophic.  Urethra has no lesions or masses. The cervix is smooth, pap with HR HPV genotyping performed.  Uterus is felt to be normal size, shape, and contour.  No adnexal masses or tenderness noted.Bladder is non tender, no masses felt. Rectal: Good sphincter tone, no polyps, or hemorrhoids felt.  Hemoccult negative. Extremities/musculoskeletal:  No swelling or varicosities noted, no clubbing or cyanosis Psych:  No mood changes, alert and cooperative,seems happy AA is 0 Fall risk is low    09/11/2022    2:34 PM 09/11/2022    2:32 PM 08/28/2021    8:37 AM  Depression screen PHQ 2/9   Decreased Interest 0 0 0  Down, Depressed, Hopeless 0 0 0  PHQ - 2 Score 0 0 0  Altered sleeping 0  1  Tired, decreased energy 0  0  Change in appetite 0  0  Feeling bad or failure about yourself  0  0  Trouble concentrating 0  0  Moving slowly or fidgety/restless 0  0  Suicidal thoughts 0  0  PHQ-9 Score 0  1  Difficult doing work/chores Not difficult at all         09/11/2022    2:34 PM 08/28/2021    8:37 AM 08/27/2020    8:37 AM 08/25/2019   10:28 AM  GAD 7 : Generalized Anxiety Score  Nervous, Anxious, on Edge 0 1 0 0  Control/stop worrying 0 0 0 0  Worry too much - different things 0 1 0 0  Trouble relaxing 0 1 0 0  Restless 0 0 0 0  Easily annoyed or irritable 0 0 0 0  Afraid - awful might happen 0 0 0 0  Total GAD 7 Score 0 3 0 0  Anxiety Difficulty Not difficult at all         Upstream - 09/11/22 1436       Pregnancy Intention Screening   Does the patient want to become pregnant in the next year? N/A    Does the patient's partner want to become pregnant in the next year? N/A    Would the patient like to discuss contraceptive  options today? No      Contraception Wrap Up   Current Method --   PM   End Method --   PM   Contraception Counseling Provided No            Examination chaperoned by Tish RN   Impression and Plan: 1. Encounter for gynecological examination with Papanicolaou smear of cervix Pap sent Pap in 3 years if negative Physical in 1 year Mammogram was negative 09/27/21 has appt 10/03/22 Had labs with PCP, and cholesterol was elevated, she is working on it Colonoscopy per GI   2. Encounter for screening fecal occult blood testing Hemoccult was negative

## 2022-09-16 ENCOUNTER — Telehealth: Payer: Self-pay | Admitting: Adult Health

## 2022-09-16 NOTE — Telephone Encounter (Signed)
Left message that I got labs from [facility] [place] and they look good

## 2022-09-17 LAB — CYTOLOGY - PAP
Comment: NEGATIVE
Diagnosis: NEGATIVE
High risk HPV: NEGATIVE

## 2022-10-03 ENCOUNTER — Ambulatory Visit (HOSPITAL_COMMUNITY)
Admission: RE | Admit: 2022-10-03 | Discharge: 2022-10-03 | Disposition: A | Discharge status: Home / Self Care | Payer: 59 | Source: Ambulatory Visit | Attending: Internal Medicine | Admitting: Internal Medicine

## 2022-10-03 DIAGNOSIS — Z1231 Encounter for screening mammogram for malignant neoplasm of breast: Secondary | ICD-10-CM | POA: Diagnosis not present

## 2022-12-03 DIAGNOSIS — E7800 Pure hypercholesterolemia, unspecified: Secondary | ICD-10-CM | POA: Diagnosis not present

## 2022-12-25 DIAGNOSIS — E7800 Pure hypercholesterolemia, unspecified: Secondary | ICD-10-CM | POA: Diagnosis not present

## 2022-12-25 DIAGNOSIS — K219 Gastro-esophageal reflux disease without esophagitis: Secondary | ICD-10-CM | POA: Diagnosis not present

## 2022-12-25 DIAGNOSIS — Z6828 Body mass index (BMI) 28.0-28.9, adult: Secondary | ICD-10-CM | POA: Diagnosis not present

## 2022-12-25 DIAGNOSIS — E663 Overweight: Secondary | ICD-10-CM | POA: Diagnosis not present

## 2023-01-22 ENCOUNTER — Other Ambulatory Visit (INDEPENDENT_AMBULATORY_CARE_PROVIDER_SITE_OTHER): Payer: 59

## 2023-01-22 ENCOUNTER — Ambulatory Visit (INDEPENDENT_AMBULATORY_CARE_PROVIDER_SITE_OTHER): Payer: 59 | Admitting: Orthopedic Surgery

## 2023-01-22 VITALS — BP 119/81 | HR 75 | Ht 64.0 in | Wt 167.0 lb

## 2023-01-22 DIAGNOSIS — S63682A Other sprain of left thumb, initial encounter: Secondary | ICD-10-CM

## 2023-01-22 DIAGNOSIS — S63689A Other sprain of unspecified thumb, initial encounter: Secondary | ICD-10-CM

## 2023-01-22 DIAGNOSIS — M79645 Pain in left finger(s): Secondary | ICD-10-CM

## 2023-01-22 NOTE — Progress Notes (Signed)
Office Visit Note   Patient: Sarah May           Date of Birth: [birth year]           MRN: [id number] Visit Date: 01/22/2023 Requested by: Avis Epley, PA-C [address] [place],  [place] [address] PCP: Avis Epley, PA-C  Chief Complaint  Patient presents with   Hand Injury   Hand Pain    Left thumb pain no ed no xrays    Sprain CMC joint recommend splinting and over-the-counter medications and follow-up in 4 weeks  Encounter Diagnoses  Name Primary?   Pain of left thumb    Sprain of carpometacarpal (CMC) joint of thumb Yes    History of present illness 62 year old female was lifting a heavy pot while cleaning the stove noticed pain at the base of her thumb.  Symptoms started about a month ago.  She has been able to pick up heavy objects since that time.  She has not had any medication at this point even over-the-counter medication.  She presents for evaluation and management   Objective: Vital Signs: BP 119/81   Pulse 75   Ht 5\' 4"  (1.626 m)   Wt 167 lb (75.8 kg)   LMP 06/10/2011   BMI 28.67 kg/m   Physical Exam Vitals and nursing note reviewed.  Constitutional:      Appearance: Normal appearance.  HENT:     Head: Normocephalic and atraumatic.  Eyes:     General: No scleral icterus.       Right eye: No discharge.        Left eye: No discharge.     Extraocular Movements: Extraocular movements intact.     Conjunctiva/sclera: Conjunctivae normal.     Pupils: Pupils are equal, round, and reactive to light.  Cardiovascular:     Rate and Rhythm: Normal rate.     Pulses: Normal pulses.  Skin:    General: Skin is warm and dry.     Capillary Refill: Capillary refill takes less than 2 seconds.  Neurological:     General: No focal deficit present.     Mental Status: She is alert and oriented to person, place, and time.  Psychiatric:        Mood and Affect: Mood normal.        Behavior: Behavior normal.        Thought Content: Thought content  normal.        Judgment: Judgment normal.      Ortho Exam  Left thumb Tenderness is at the base of the thumb with positive crepitance range of motion remains normal collateral ligaments are stable flexion extension IP joint normal    Specialty Comments:  No specialty comments available.  Imaging: DG Finger Thumb Left  Result Date: 01/22/2023 Left thumb Acute pain CMC joint X-rays mild narrowing at [facility] joint, small spurs No acute fracture but CMC arthritis mild     PMFS History: Patient Active Problem List   Diagnosis Date Noted   Encounter for screening fecal occult blood testing 08/28/2021   Encounter for gynecological examination 08/28/2021   Encounter for gynecological examination with Papanicolaou smear of cervix 08/25/2019   Screening for colorectal cancer 08/25/2019   Abnormality of left breast on screening mammogram 10/29/2018   Burning with urination 05/10/2015   Lower urinary tract infectious disease 05/10/2015   Constipation 05/08/2014   Stress 05/08/2014   Past Medical History:  Diagnosis Date   Abnormality of left breast  on screening mammogram 10/29/2018   Anemia    Burning with urination 05/10/2015   Constipation 05/08/2014   Stress 05/08/2014   UTI (lower urinary tract infection) 05/10/2015    Family History  Problem Relation Age of Onset   Dementia Mother    Congestive Heart Failure Father    Breast cancer Sister    Down syndrome Brother    Breast cancer Maternal Aunt    Dementia Maternal Grandmother    Cancer Paternal Grandmother    Cancer Paternal Grandfather    Colon cancer Neg Hx    Colon polyps Neg Hx     Past Surgical History:  Procedure Laterality Date   BREAST LUMPECTOMY WITH RADIOACTIVE SEED LOCALIZATION Left 10/29/2018   Procedure: LEFT BREAST LUMPECTOMY X2 WITH RADIOACTIVE SEED LOCALIZATION X2;  Surgeon: Claud Kelp, MD;  Location: [facility];  Service: General;  Laterality: Left;   COLONOSCOPY  04/01/2011    Dr.Brodie   Social History   Occupational History   Not on file  Tobacco Use   Smoking status: Never   Smokeless tobacco: Never  Vaping Use   Vaping status: Never Used  Substance and Sexual Activity   Alcohol use: No   Drug use: No   Sexual activity: Not Currently    Birth control/protection: Post-menopausal, Abstinence

## 2023-02-19 ENCOUNTER — Ambulatory Visit: Payer: 59 | Admitting: Orthopedic Surgery

## 2023-03-31 DIAGNOSIS — D2271 Melanocytic nevi of right lower limb, including hip: Secondary | ICD-10-CM | POA: Diagnosis not present

## 2023-05-08 ENCOUNTER — Ambulatory Visit: Payer: 59 | Admitting: Nurse Practitioner

## 2023-05-08 ENCOUNTER — Encounter: Payer: Self-pay | Admitting: Nurse Practitioner

## 2023-05-08 VITALS — BP 122/90 | HR 80 | Ht 64.0 in | Wt 165.2 lb

## 2023-05-08 DIAGNOSIS — K219 Gastro-esophageal reflux disease without esophagitis: Secondary | ICD-10-CM

## 2023-05-08 DIAGNOSIS — Z860101 Personal history of adenomatous and serrated colon polyps: Secondary | ICD-10-CM

## 2023-05-08 DIAGNOSIS — R1310 Dysphagia, unspecified: Secondary | ICD-10-CM

## 2023-05-08 MED ORDER — OMEPRAZOLE 20 MG PO CPDR: 20.0000 mg | DELAYED_RELEASE_CAPSULE | Freq: Every day | ORAL | 1 refills | Status: DC

## 2023-05-08 NOTE — Patient Instructions (Addendum)
You have been scheduled for an endoscopy. Please follow written instructions given to you at your visit today.  If you use inhalers (even only as needed), please bring them with you on the day of your procedure. _____________________________________________ We have sent the following medications to your pharmacy for you to pick up at your convenience: Omeprazole 20 mg  Famotidine 20 mg- take 1 by mouth at bedtime (over the counter)  Gaviscon- take 1 tablespoon by mouth 3 times daily as needed (Over the counter)  Due to recent changes in healthcare laws, you may see the results of your imaging and laboratory studies on MyChart before your provider has had a chance to review them.  We understand that in some cases there may be results that are confusing or concerning to you. Not all laboratory results come back in the same time frame and the provider may be waiting for multiple results in order to interpret others.  Please give [place] 48 hours in order for your provider to thoroughly review all the results before contacting the office for clarification of your results.   Thank you for trusting me with your gastrointestinal care!   Alcide Evener, CRNP

## 2023-05-08 NOTE — Progress Notes (Signed)
05/08/2023 Sarah May [id number] 05/12/1960   Chief Complaint: Pain in esophagus after eating   History of Present Illness: Sarah May is a 63 year old female with a past medical history of anemia and constipation.  She presents to our office today as referred by Terie Purser, PA-C for further evaluation regarding GERD, esophageal pain and throat clearing.  She describes having a burning pain in her esophagus which typically occurs 30 minutes after eating which also results in coughing for the past 3 months.  No specific food triggers.  No chest pain with exertion. She denies having classic heartburn. Food or liquids do not get stuck in the esophagus.  She took famotidine 20 mg daily for few weeks without improvement.  She switched to Omeprazole 20 mg once daily 2 weeks ago and her esophageal discomfort slightly improved but did not abate.  She took Pantoprazole in the past which was effective but was not covered by insurance.  She denies having any upper or lower abdominal pain.  No nausea or vomiting.  She is passing a normal formed brown bowel movement most days.  No rectal bleeding or black stools.  No recent antibiotics or steroids.  She denies ever having an EGD.  Her most recent colonoscopy was 08/07/2021 which identified one 12 mm tubular adenomatous polyp removed from the ascending colon and diverticulosis to the sigmoid colon.  She was advised to repeat a colonoscopy in 3 years.  No NSAID use.    PAST GI PROCEDURES:  Colonoscopy 08/07/2021: - One 12 mm tubular adenomatous polyp in the ascending colon, removed with a hot snare. Resected and retrieved.  - Diverticulosis in the sigmoid colon.  - Non-bleeding external and internal hemorrhoids. -Recall colonoscopy 3 years   Past Medical History:  Diagnosis Date   Abnormality of left breast on screening mammogram 10/29/2018   Anemia    Burning with urination 05/10/2015   Constipation 05/08/2014   Stress 05/08/2014   UTI (lower  urinary tract infection) 05/10/2015   Past Surgical History:  Procedure Laterality Date   BREAST LUMPECTOMY WITH RADIOACTIVE SEED LOCALIZATION Left 10/29/2018   Procedure: LEFT BREAST LUMPECTOMY X2 WITH RADIOACTIVE SEED LOCALIZATION X2;  Surgeon: Claud Kelp, MD;  Location: [facility];  Service: General;  Laterality: Left;   COLONOSCOPY  04/01/2011   Dr.Brodie   Current Outpatient Medications on File Prior to Visit  Medication Sig Dispense Refill   famotidine (PEPCID) 20 MG tablet      sertraline (ZOLOFT) 25 MG tablet Take 25 mg by mouth daily.     cholecalciferol (VITAMIN D3) 25 MCG (1000 UNIT) tablet Take 1,000 Units by mouth daily. (Patient not taking: Reported on 05/08/2023)     No current facility-administered medications on file prior to visit.   No Known Allergies   Current Medications, Allergies, Past Medical History, Past Surgical History, Family History and Social History were reviewed in [employer] record.  Review of Systems:   Constitutional: Negative for fever, sweats, chills or weight loss.  Respiratory: Negative for shortness of breath.   Cardiovascular: No chest pain, palpitations or edema.  Gastrointestinal: See HPI.  Musculoskeletal: Negative for back pain or muscle aches.  Neurological: Negative for dizziness, headaches or paresthesias.   Physical Exam: LMP 06/10/2011  General: in no acute distress. Head: Normocephalic and atraumatic. Eyes: No scleral icterus. Conjunctiva pink . Ears: Normal auditory acuity. Mouth: Dentition intact. No ulcers or lesions.  Lungs: Clear throughout to auscultation. Heart:  Regular rate and rhythm, no murmur. Abdomen: Soft, nontender and nondistended. No masses or hepatomegaly. Normal bowel sounds x 4 quadrants.  Rectal: Deferred.  Musculoskeletal: Symmetrical with no gross deformities. Extremities: No edema. Neurological: Alert oriented x 4. No focal deficits.  Psychological: Alert  and cooperative. Normal mood and affect  Assessment and Recommendations:  63 year old female with burning esophageal pain which occurs 30 minutes after eating for the past 3 months.  No classic heartburn.  No NSAID use. -Omeprazole 20 mg p.o. daily to be taken 30 minutes before breakfast -Famotidine 20 mg 1 tab p.o. Q HS -Gaviscon 1 tablespoon by mouth 3 times daily as needed -GERD diet handout -EGD benefits and risks discussed including risk with sedation, risk of bleeding, perforation and infection  -Patient to contact office if symptoms worsen  History of colon polyps.  Colonoscopy 08/07/2021 identified 112 mm tubular adenomatous polyp removed from the ascending colon. -Next colonoscopy due 07/2024

## 2023-06-12 ENCOUNTER — Encounter: Payer: 59 | Admitting: Gastroenterology

## 2023-07-22 ENCOUNTER — Other Ambulatory Visit (HOSPITAL_COMMUNITY): Payer: Self-pay | Admitting: Internal Medicine

## 2023-07-22 DIAGNOSIS — Z1231 Encounter for screening mammogram for malignant neoplasm of breast: Secondary | ICD-10-CM

## 2023-08-06 DIAGNOSIS — H40013 Open angle with borderline findings, low risk, bilateral: Secondary | ICD-10-CM | POA: Diagnosis not present

## 2023-09-17 ENCOUNTER — Ambulatory Visit (INDEPENDENT_AMBULATORY_CARE_PROVIDER_SITE_OTHER): Admitting: Adult Health

## 2023-09-17 ENCOUNTER — Encounter: Payer: Self-pay | Admitting: Adult Health

## 2023-09-17 VITALS — BP 145/81 | HR 61 | Ht 64.0 in | Wt 171.0 lb

## 2023-09-17 DIAGNOSIS — Z1331 Encounter for screening for depression: Secondary | ICD-10-CM

## 2023-09-17 DIAGNOSIS — R030 Elevated blood-pressure reading, without diagnosis of hypertension: Secondary | ICD-10-CM | POA: Diagnosis not present

## 2023-09-17 DIAGNOSIS — Z01419 Encounter for gynecological examination (general) (routine) without abnormal findings: Secondary | ICD-10-CM | POA: Diagnosis not present

## 2023-09-17 NOTE — Progress Notes (Signed)
 Patient ID: Sarah May, female   DOB: 11-30-1960, 63 y.o.   MRN: [id number] History of Present Illness: Sarah May is a 63 year old white female, single, PM in for a well woman gyn exam.  PCP is Leverne Reading PA    Current Medications, Allergies, Past Medical History, Past Surgical History, Family History and Social History were reviewed in [employer] electronic medical record.     Review of Systems: Patient denies any headaches, hearing loss, fatigue, blurred vision, shortness of breath, chest pain, abdominal pain, problems with bowel movements,(constipated today) urination, or intercourse(not active). No joint pain or mood swings.     Physical Exam:BP (!) 145/81 (BP Location: Left Arm, Patient Position: Sitting, Cuff Size: Normal)   Pulse 61   Ht 5\' 4"  (1.626 m)   Wt 171 lb (77.6 kg)   LMP 06/10/2011   BMI 29.35 kg/m   General:  Well developed, well nourished, no acute distress Skin:  Warm and dry Neck:  Midline trachea, normal thyroid, good ROM, no lymphadenopathy,no carotid bruits heard  Lungs; Clear to auscultation bilaterally Breast:  No dominant palpable mass, retraction, or nipple discharge Cardiovascular: Regular rate and rhythm Abdomen:  Soft, non tender, no hepatosplenomegaly Pelvic:  External genitalia is normal in appearance, no lesions.  The vagina is pale. Urethra has no lesions or masses. The cervix is smooth.  Uterus is felt to be normal size, shape, and contour.  No adnexal masses or tenderness noted.Bladder is non tender, no masses felt. Rectal: Deferred at her request Extremities/musculoskeletal:  No swelling or varicosities noted, no clubbing or cyanosis Psych:  No mood changes, alert and cooperative,seems happy AA is 1 Fall risk is low    09/17/2023    8:41 AM 09/11/2022    2:34 PM 09/11/2022    2:32 PM  Depression screen PHQ 2/9  Decreased Interest 0 0 0  Down, Depressed, Hopeless 0 0 0  PHQ - 2 Score 0 0 0  Altered sleeping 0 0   Tired, decreased  energy 0 0   Change in appetite 0 0   Feeling bad or failure about yourself  0 0   Trouble concentrating 0 0   Moving slowly or fidgety/restless 0 0   Suicidal thoughts 0 0   PHQ-9 Score 0 0   Difficult doing work/chores  Not difficult at all        09/17/2023    8:42 AM 09/11/2022    2:34 PM 08/28/2021    8:37 AM 08/27/2020    8:37 AM  GAD 7 : Generalized Anxiety Score  Nervous, Anxious, on Edge 0 0 1 0  Control/stop worrying 0 0 0 0  Worry too much - different things 0 0 1 0  Trouble relaxing 0 0 1 0  Restless 0 0 0 0  Easily annoyed or irritable 0 0 0 0  Afraid - awful might happen 0 0 0 0  Total GAD 7 Score 0 0 3 0  Anxiety Difficulty  Not difficult at all        Upstream - 09/17/23 4098       Pregnancy Intention Screening   Does the patient want to become pregnant in the next year? N/A    Does the patient's partner want to become pregnant in the next year? N/A    Would the patient like to discuss contraceptive options today? N/A      Contraception Wrap Up   Current Method Abstinence   PM   End Method  Abstinence   PM   Contraception Counseling Provided No             Examination chaperoned by Alphonso Aschoff LPN  Impression and plan: 1. Encounter for well woman exam with routine gynecological exam (Primary) Physical in 1 year Pap in 2027 Labs with PCP Mammogram was negative 10/03/22 scheduled for 10/05/23 Colonoscopy per GI  Stay active   2. Elevated BP without diagnosis of hypertension Keep check on BP, follow up with PCP Decrease salt and sugar

## 2023-09-25 DIAGNOSIS — R7309 Other abnormal glucose: Secondary | ICD-10-CM | POA: Diagnosis not present

## 2023-09-25 DIAGNOSIS — F419 Anxiety disorder, unspecified: Secondary | ICD-10-CM | POA: Diagnosis not present

## 2023-09-25 DIAGNOSIS — E7800 Pure hypercholesterolemia, unspecified: Secondary | ICD-10-CM | POA: Diagnosis not present

## 2023-09-25 DIAGNOSIS — Z6828 Body mass index (BMI) 28.0-28.9, adult: Secondary | ICD-10-CM | POA: Diagnosis not present

## 2023-09-25 DIAGNOSIS — K219 Gastro-esophageal reflux disease without esophagitis: Secondary | ICD-10-CM | POA: Diagnosis not present

## 2023-09-25 DIAGNOSIS — Z1331 Encounter for screening for depression: Secondary | ICD-10-CM | POA: Diagnosis not present

## 2023-09-25 DIAGNOSIS — Z0001 Encounter for general adult medical examination with abnormal findings: Secondary | ICD-10-CM | POA: Diagnosis not present

## 2023-09-25 DIAGNOSIS — E663 Overweight: Secondary | ICD-10-CM | POA: Diagnosis not present

## 2023-10-05 ENCOUNTER — Ambulatory Visit (HOSPITAL_COMMUNITY)
Admission: RE | Admit: 2023-10-05 | Discharge: 2023-10-05 | Disposition: A | Discharge status: Home / Self Care | Source: Ambulatory Visit | Attending: Internal Medicine | Admitting: Internal Medicine

## 2023-10-05 DIAGNOSIS — Z1231 Encounter for screening mammogram for malignant neoplasm of breast: Secondary | ICD-10-CM | POA: Diagnosis not present

## 2023-10-27 DIAGNOSIS — K219 Gastro-esophageal reflux disease without esophagitis: Secondary | ICD-10-CM | POA: Diagnosis not present

## 2023-10-27 DIAGNOSIS — F419 Anxiety disorder, unspecified: Secondary | ICD-10-CM | POA: Diagnosis not present

## 2023-10-27 DIAGNOSIS — F325 Major depressive disorder, single episode, in full remission: Secondary | ICD-10-CM | POA: Diagnosis not present

## 2023-10-27 DIAGNOSIS — N181 Chronic kidney disease, stage 1: Secondary | ICD-10-CM | POA: Diagnosis not present

## 2023-10-27 DIAGNOSIS — E785 Hyperlipidemia, unspecified: Secondary | ICD-10-CM | POA: Diagnosis not present

## 2023-10-27 DIAGNOSIS — Z8249 Family history of ischemic heart disease and other diseases of the circulatory system: Secondary | ICD-10-CM | POA: Diagnosis not present

## 2023-10-27 DIAGNOSIS — Z809 Family history of malignant neoplasm, unspecified: Secondary | ICD-10-CM | POA: Diagnosis not present

## 2023-10-27 DIAGNOSIS — Z833 Family history of diabetes mellitus: Secondary | ICD-10-CM | POA: Diagnosis not present

## 2023-10-27 DIAGNOSIS — Z86006 Personal history of melanoma in-situ: Secondary | ICD-10-CM | POA: Diagnosis not present

## 2024-01-28 NOTE — Progress Notes (Unsigned)
 Chief Complaint: GERD  HPI:    Sarah May is a 63 year old female, known to Dr. Shila, with a past medical history as listed below including constipation, who was referred to me by Leonce Lucie PARAS, PA* for a complaint of GERD.      08/07/21 colonoscopy with 1 12 mm polyp in the ascending colon, diverticulosis in the sigmoid colon and nonbleeding external and internal hemorrhoids.  Pathology showed adenomatous polyp and repeat recommended in 3 years.    05/08/2023 patient seen in clinic by Sarah May for GERD, esophageal pain and throat clearing.  At that time given Omeprazole  20 mg take daily before breakfast.  Continued on Famotidine 20 mg at night.  Commended Gaviscon 1 tablespoon eval 3 times daily as needed.  EGD recommended.  Past Medical History:  Diagnosis Date   Abnormality of left breast on screening mammogram 10/29/2018   Anemia    Burning with urination 05/10/2015   Constipation 05/08/2014   Stress 05/08/2014   UTI (lower urinary tract infection) 05/10/2015    Past Surgical History:  Procedure Laterality Date   BREAST LUMPECTOMY WITH RADIOACTIVE SEED LOCALIZATION Left 10/29/2018   Procedure: LEFT BREAST LUMPECTOMY X2 WITH RADIOACTIVE SEED LOCALIZATION X2;  Surgeon: Gail Favorite, MD;  Location: [facility];  Service: General;  Laterality: Left;   COLONOSCOPY  04/01/2011   Dr.Brodie    Current Outpatient Medications  Medication Sig Dispense Refill   famotidine (PEPCID) 20 MG tablet      omeprazole  (PRILOSEC) 20 MG capsule Take 1 capsule (20 mg total) by mouth daily. Take 30 minutes before breakfast. 30 capsule 1   sertraline  (ZOLOFT ) 25 MG tablet Take 25 mg by mouth daily.     No current facility-administered medications for this visit.    Allergies as of 01/29/2024   (No Known Allergies)    Family History  Problem Relation Age of Onset   Dementia Mother    Congestive Heart Failure Father    Breast cancer Sister    Down syndrome  Brother    Breast cancer Maternal Aunt    Dementia Maternal Grandmother    Cancer Paternal Grandmother    Cancer Paternal Grandfather    Colon cancer Neg Hx    Colon polyps Neg Hx     Social History   Socioeconomic History   Marital status: Single    Spouse name: Not on file   Number of children: 0   Years of education: Not on file   Highest education level: Not on file  Occupational History   Not on file  Tobacco Use   Smoking status: Never   Smokeless tobacco: Never  Vaping Use   Vaping status: Never Used  Substance and Sexual Activity   Alcohol use: Yes   Drug use: No   Sexual activity: Not Currently    Birth control/protection: Post-menopausal, Abstinence  Other Topics Concern   Not on file  Social History Narrative   Not on file   Social Drivers of Health   Financial Resource Strain: Low Risk  (09/17/2023)   Overall Financial Resource Strain (CARDIA)    Difficulty of Paying Living Expenses: Not hard at all  Food Insecurity: No Food Insecurity (09/17/2023)   Hunger Vital Sign    Worried About Running Out of Food in the Last Year: Never true    Ran Out of Food in the Last Year: Never true  Transportation Needs: No Transportation Needs (09/17/2023)   PRAPARE - Transportation  Lack of Transportation (Medical): No    Lack of Transportation (Non-Medical): No  Physical Activity: Insufficiently Active (09/17/2023)   Exercise Vital Sign    Days of Exercise per Week: 3 days    Minutes of Exercise per Session: 20 min  Stress: No Stress Concern Present (09/17/2023)   [employer] of Occupational Health - Occupational Stress Questionnaire    Feeling of Stress : Only a little  Social Connections: Moderately Integrated (09/17/2023)   Social Connection and Isolation Panel    Frequency of Communication with Friends and Family: Three times a week    Frequency of Social Gatherings with Friends and Family: Twice a week    Attends Religious Services: More than 4 times per  year    Active Member of [employer] or Organizations: Yes    Attends Engineer, structural: More than 4 times per year    Marital Status: Never married  Intimate Partner Violence: Not At Risk (09/17/2023)   Humiliation, Afraid, Rape, and Kick questionnaire    Fear of Current or Ex-Partner: No    Emotionally Abused: No    Physically Abused: No    Sexually Abused: No    Review of Systems:    Constitutional: No weight loss, fever, chills, weakness or fatigue HEENT: Eyes: No change in vision               Ears, Nose, Throat:  No change in hearing or congestion Skin: No rash or itching Cardiovascular: No chest pain, chest pressure or palpitations   Respiratory: No SOB or cough Gastrointestinal: See HPI and otherwise negative Genitourinary: No dysuria or change in urinary frequency Neurological: No headache, dizziness or syncope Musculoskeletal: No new muscle or joint pain Hematologic: No bleeding or bruising Psychiatric: No history of depression or anxiety    Physical Exam:  Vital signs: LMP 06/10/2011   Constitutional:   Pleasant Caucasian female appears to be in NAD, Well developed, Well nourished, alert and cooperative Head:  Normocephalic and atraumatic. Eyes:   PEERL, EOMI. No icterus. Conjunctiva pink. Ears:  Normal auditory acuity. Neck:  Supple Throat: Oral cavity and pharynx without inflammation, swelling or lesion.  Respiratory: Respirations even and unlabored. Lungs clear to auscultation bilaterally.   No wheezes, crackles, or rhonchi.  Cardiovascular: Normal S1, S2. No MRG. Regular rate and rhythm. No peripheral edema, cyanosis or pallor.  Gastrointestinal:  Soft, nondistended, nontender. No rebound or guarding. Normal bowel sounds. No appreciable masses or hepatomegaly. Rectal:  Not performed.  Msk:  Symmetrical without gross deformities. Without edema, no deformity or joint abnormality.  Neurologic:  Alert and  oriented x4;  grossly normal neurologically.  Skin:    Dry and intact without significant lesions or rashes. Psychiatric: Oriented to person, place and time. Demonstrates good judgement and reason without abnormal affect or behaviors.  RELEVANT LABS AND IMAGING: CBC    Component Value Date/Time   WBC 7.1 10/26/2018 1223   RBC 4.63 10/26/2018 1223   HGB 13.5 10/26/2018 1223   HCT 41.0 10/26/2018 1223   PLT 293 10/26/2018 1223   MCV 88.6 10/26/2018 1223   MCH 29.2 10/26/2018 1223   MCHC 32.9 10/26/2018 1223   RDW 12.2 10/26/2018 1223   LYMPHSABS 1.7 10/26/2018 1223   MONOABS 0.4 10/26/2018 1223   EOSABS 0.2 10/26/2018 1223   BASOSABS 0.1 10/26/2018 1223    CMP     Component Value Date/Time   NA 140 10/26/2018 1223   K 4.7 10/26/2018 1223   CL 106 10/26/2018  1223   CO2 26 10/26/2018 1223   GLUCOSE 97 10/26/2018 1223   BUN 17 10/26/2018 1223   CREATININE 0.88 10/26/2018 1223   CALCIUM 9.3 10/26/2018 1223   PROT 6.6 10/26/2018 1223   ALBUMIN 4.0 10/26/2018 1223   AST 14 (L) 10/26/2018 1223   ALT 15 10/26/2018 1223   ALKPHOS 68 10/26/2018 1223   BILITOT 0.8 10/26/2018 1223   GFRNONAA >60 10/26/2018 1223   GFRAA >60 10/26/2018 1223    Assessment: 1.  GERD: 2.  History of adenomatous polyp: Last colonoscopy in April 2023 with repeat recommended in 3 years given the large adenomatous polyp  Plan: 1. ***     Delon Failing, PA-C [facility] 01/28/2024, 4:10 PM  Cc: Leonce Lucie PARAS, PA*

## 2024-01-29 ENCOUNTER — Ambulatory Visit: Admitting: Physician Assistant

## 2024-01-29 ENCOUNTER — Encounter: Payer: Self-pay | Admitting: Physician Assistant

## 2024-01-29 VITALS — BP 132/80 | HR 60 | Ht 64.0 in | Wt 169.8 lb

## 2024-01-29 DIAGNOSIS — K219 Gastro-esophageal reflux disease without esophagitis: Secondary | ICD-10-CM

## 2024-01-29 DIAGNOSIS — Z860101 Personal history of adenomatous and serrated colon polyps: Secondary | ICD-10-CM

## 2024-01-29 MED ORDER — OMEPRAZOLE 40 MG PO CPDR
40.0000 mg | DELAYED_RELEASE_CAPSULE | Freq: Every day | ORAL | 5 refills | Status: AC
Start: 2024-01-29 — End: ?

## 2024-01-29 NOTE — Patient Instructions (Signed)
 We have sent the following medications to your pharmacy for you to pick up at your convenience: Omeprazole  40 mg daily 30-60 minutes before breakfast.   Continue Famotidine 20 mg nightly before bedtime.   _______________________________________________________  If your blood pressure at your visit was 140/90 or greater, please contact your primary care physician to follow up on this.  _______________________________________________________  If you are age 63 or older, your body mass index should be between 23-30. Your Body mass index is 29.15 kg/m. If this is out of the aforementioned range listed, please consider follow up with your Primary Care Provider.  If you are age 63 or younger, your body mass index should be between 19-25. Your Body mass index is 29.15 kg/m. If this is out of the aformentioned range listed, please consider follow up with your Primary Care Provider.   ________________________________________________________  The [facility] providers would like to encourage you to use MYCHART to communicate with providers for non-urgent requests or questions.  Due to long hold times on the telephone, sending your provider a message by [facility] may be a faster and more efficient way to get a response.  Please allow 48 business hours for a response.  Please remember that this is for non-urgent requests.  _______________________________________________________  [facility] is using a team-based approach to care.  Your team is made up of your doctor and two to three APPS. Our APPS (Nurse Practitioners and Physician Assistants) work with your physician to ensure care continuity for you. They are fully qualified to address your health concerns and develop a treatment plan. They communicate directly with your gastroenterologist to care for you. Seeing the Advanced Practice Practitioners on your physician's team can help you by facilitating care more promptly, often allowing for  earlier appointments, access to diagnostic testing, procedures, and other specialty referrals.

## 2024-03-28 DIAGNOSIS — R030 Elevated blood-pressure reading, without diagnosis of hypertension: Secondary | ICD-10-CM | POA: Diagnosis not present

## 2024-03-28 DIAGNOSIS — R519 Headache, unspecified: Secondary | ICD-10-CM | POA: Diagnosis not present

## 2024-03-28 DIAGNOSIS — E782 Mixed hyperlipidemia: Secondary | ICD-10-CM | POA: Diagnosis not present

## 2024-03-28 DIAGNOSIS — F419 Anxiety disorder, unspecified: Secondary | ICD-10-CM | POA: Diagnosis not present

## 2024-03-28 DIAGNOSIS — K219 Gastro-esophageal reflux disease without esophagitis: Secondary | ICD-10-CM | POA: Diagnosis not present

## 2024-03-28 DIAGNOSIS — Z6829 Body mass index (BMI) 29.0-29.9, adult: Secondary | ICD-10-CM | POA: Diagnosis not present

## 2024-03-28 DIAGNOSIS — R7309 Other abnormal glucose: Secondary | ICD-10-CM | POA: Diagnosis not present

## 2024-04-28 NOTE — Progress Notes (Signed)
 "  Chief Complaint: Follow-up GERD  HPI:    [name] is a 64 year old female known to Dr. Shila, with past medical history as listed below including anemia, constipation, who presents to clinic today for follow-up of GERD.    08/07/21 colonoscopy with 1 12 mm polyp in the ascending colon, diverticulosis in the sigmoid colon and nonbleeding external and internal hemorrhoids.  Pathology showed adenomatous polyp and repeat recommended in 3 years.    05/08/2023 patient seen in clinic by Elida Nyle Sharps for GERD, esophageal pain and throat clearing.  At that time given Omeprazole  20 mg take daily before breakfast.  Continued on Famotidine 20 mg at night.  Commended Gaviscon 1 tablespoon eval 3 times daily as needed.  EGD recommended.    01/29/2024 patient seen in clinic by me and at that time described doing well for a while with Omeprazole  20 mg in the morning and Pepcid 20 mg at night but 3 weeks ago she had a piece of pizza which irritated her reflux.  At that point increased Omeprazole  to 40 mg daily.  Continued on Famotidine 20 mg nightly.  Discussed that this did not help then would recommend increasing to twice daily dosing omeprazole  for a while.  Patient told to follow in January, discussed depending how she is doing could consider adding an EGD to her colonoscopy which we do in January.  Discussed the use of AI scribe software for clinical note transcription with the patient, who gave verbal consent to proceed.  History of Present Illness Since October, she has experienced five episodes of nocturnal reflux, with the most recent on January 1st. These episodes awaken her from sleep, last approximately thirty minutes, and are relieved by baking soda. She associates these episodes with dietary indiscretions and perceives a sensation of acid during the events. No severe reflux symptoms have occurred for several weeks prior to this visit. She continues Omeprazole  40 mg daily and Famotidine 20 mg  nightly, and does not require a refill.  Four to five days prior to the visit, she developed a persistent sore throat, treated with salt water gargles without improvement. She describes mild hoarseness and some discomfort with swallowing, but is able to eat normally. She denies fever, chills, and cough, no sick contacts.  She experiences dry mouth at night, and is unsure if this is related to medication, snoring, or sleeping with her mouth open. She recently started rosuvastatin 5 mg and is not taking any other new medications.  Denies fever, chills, weight loss, nausea or vomiting.    Past Medical History:  Diagnosis Date   Abnormality of left breast on screening mammogram 10/29/2018   Anemia    Burning with urination 05/10/2015   Constipation 05/08/2014   Stress 05/08/2014   UTI (lower urinary tract infection) 05/10/2015    Past Surgical History:  Procedure Laterality Date   BREAST LUMPECTOMY WITH RADIOACTIVE SEED LOCALIZATION Left 10/29/2018   Procedure: LEFT BREAST LUMPECTOMY X2 WITH RADIOACTIVE SEED LOCALIZATION X2;  Surgeon: Gail Favorite, MD;  Location: [facility];  Service: General;  Laterality: Left;   COLONOSCOPY  04/01/2011   Dr.Brodie    Current Outpatient Medications  Medication Sig Dispense Refill   famotidine (PEPCID) 20 MG tablet Take 20 mg by mouth at bedtime.     omeprazole  (PRILOSEC) 40 MG capsule Take 1 capsule (40 mg total) by mouth daily. 30 capsule 5   sertraline  (ZOLOFT ) 25 MG tablet Take 25 mg by mouth daily.  No current facility-administered medications for this visit.    Allergies as of 04/29/2024   (No Known Allergies)    Family History  Problem Relation Age of Onset   Dementia Mother    Congestive Heart Failure Father    Breast cancer Sister    Down syndrome Brother    Breast cancer Maternal Aunt    Dementia Maternal Grandmother    Cancer Paternal Grandmother    Cancer Paternal Grandfather    Colon cancer Neg Hx    Colon  polyps Neg Hx     Social History   Socioeconomic History   Marital status: Single    Spouse name: Not on file   Number of children: 0   Years of education: Not on file   Highest education level: Not on file  Occupational History   Not on file  Tobacco Use   Smoking status: Never   Smokeless tobacco: Never  Vaping Use   Vaping status: Never Used  Substance and Sexual Activity   Alcohol use: Yes   Drug use: No   Sexual activity: Not Currently    Birth control/protection: Post-menopausal, Abstinence  Other Topics Concern   Not on file  Social History Narrative   Not on file   Social Drivers of Health   Tobacco Use: Low Risk (01/29/2024)   Patient History    Smoking Tobacco Use: Never    Smokeless Tobacco Use: Never    Passive Exposure: Not on file  Financial Resource Strain: Low Risk (09/17/2023)   Overall Financial Resource Strain (CARDIA)    Difficulty of Paying Living Expenses: Not hard at all  Food Insecurity: No Food Insecurity (09/17/2023)   Hunger Vital Sign    Worried About Running Out of Food in the Last Year: Never true    Ran Out of Food in the Last Year: Never true  Transportation Needs: No Transportation Needs (09/17/2023)   PRAPARE - Administrator, Civil Service (Medical): No    Lack of Transportation (Non-Medical): No  Physical Activity: Insufficiently Active (09/17/2023)   Exercise Vital Sign    Days of Exercise per Week: 3 days    Minutes of Exercise per Session: 20 min  Stress: No Stress Concern Present (09/17/2023)   [employer] of Occupational Health - Occupational Stress Questionnaire    Feeling of Stress : Only a little  Social Connections: Moderately Integrated (09/17/2023)   Social Connection and Isolation Panel    Frequency of Communication with Friends and Family: Three times a week    Frequency of Social Gatherings with Friends and Family: Twice a week    Attends Religious Services: More than 4 times per year    Active  Member of Clubs or Organizations: Yes    Attends Banker Meetings: More than 4 times per year    Marital Status: Never married  Intimate Partner Violence: Not At Risk (09/17/2023)   Humiliation, Afraid, Rape, and Kick questionnaire    Fear of Current or Ex-Partner: No    Emotionally Abused: No    Physically Abused: No    Sexually Abused: No  Depression (PHQ2-9): Low Risk (09/17/2023)   Depression (PHQ2-9)    PHQ-2 Score: 0  Alcohol Screen: Low Risk (09/17/2023)   Alcohol Screen    Last Alcohol Screening Score (AUDIT): 1  Housing: Low Risk (09/17/2023)   Housing Stability Vital Sign    Unable to Pay for Housing in the Last Year: No    Number of Times Moved  in the Last Year: 0    Homeless in the Last Year: No  Utilities: Not At Risk (09/17/2023)   AHC Utilities    Threatened with loss of utilities: No  Health Literacy: Not on file    Review of Systems:    Constitutional: No weight loss, fever or chills Cardiovascular: No chest pain   Respiratory: No SOB  Gastrointestinal: See HPI and otherwise negative   Physical Exam:  Vital signs: BP 122/74   Pulse 84   Ht 5' 4 (1.626 m)   Wt 172 lb 6 oz (78.2 kg)   LMP 06/10/2011   BMI 29.59 kg/m    Constitutional:   Pleasant elderly Caucasian female appears to be in NAD, Well developed, Well nourished, alert and cooperative Respiratory: Respirations even and unlabored. Lungs clear to auscultation bilaterally.   No wheezes, crackles, or rhonchi.  Cardiovascular: Normal S1, S2. No MRG. Regular rate and rhythm. No peripheral edema, cyanosis or pallor.  Gastrointestinal:  Soft, nondistended, nontender. No rebound or guarding. Normal bowel sounds. No appreciable masses or hepatomegaly. Rectal:  Not performed.  Psychiatric: Demonstrates good judgement and reason without abnormal affect or behaviors.  No recent labs or imaging.  Assessment: 1.  GERD: Typically controlled on Omeprazole  20 mg in the morning and 20 mg at night  until recent, increased to Omeprazole  40 mg in the morning in October due to increase in symptoms, patient feels like symptoms are 90% better and would like to stay at her current dosing regardless of 5 nocturnal awakenings with reflux symptoms; likely baseline gastritis 2.  History of adenomatous polyps: Last colonoscopy in April 2023 with repeat recommended 3 years given large adenomatous polyp  Plan: 1.  Patient is due for repeat colonoscopy in April, will add on an EGD for further evaluation of increasing reflux symptoms and to ensure that she does not need to be on higher therapy.  She was placed in recall for this as we do not have the schedule. 2.  Continue on Omeprazole  40 mg every morning and Famotidine 20 mg at night.  She can continue these medications for the next year and can have refills as necessary. 3.  Reviewed antireflux diet Lysa modifications. 4.  Discussed dry mouth at night, would recommend she discuss this with her PCP, that is not a common side effect to her GI meds. 5.  Briefly discussed sore throat.  This could be viral but if it continues would recommend that she let me know.  Patient to follow-up clinic per recommendations after time of procedures.  Delon Failing, PA-C  [facility]enter[facility] 04/28/2024, 11:40 AM  Cc: Leonce Lucie PARAS, PA-C  "

## 2024-04-29 ENCOUNTER — Ambulatory Visit: Admitting: Physician Assistant

## 2024-04-29 ENCOUNTER — Encounter: Payer: Self-pay | Admitting: Physician Assistant

## 2024-04-29 VITALS — BP 122/74 | HR 84 | Ht 64.0 in | Wt 172.4 lb

## 2024-04-29 DIAGNOSIS — K219 Gastro-esophageal reflux disease without esophagitis: Secondary | ICD-10-CM

## 2024-04-29 DIAGNOSIS — Z860101 Personal history of adenomatous and serrated colon polyps: Secondary | ICD-10-CM | POA: Diagnosis not present

## 2024-04-29 DIAGNOSIS — J029 Acute pharyngitis, unspecified: Secondary | ICD-10-CM

## 2024-04-29 NOTE — Patient Instructions (Signed)
 We will contact you when the April schedule for the hospital becomes available.   Continue omeprazole  40 mg daily and famotidine 20 mg at bedtime.   _______________________________________________________  If your blood pressure at your visit was 140/90 or greater, please contact your primary care physician to follow up on this.  _______________________________________________________  If you are age 64 or older, your body mass index should be between 23-30. Your Body mass index is 29.59 kg/m. If this is out of the aforementioned range listed, please consider follow up with your Primary Care Provider.  If you are age 81 or younger, your body mass index should be between 19-25. Your Body mass index is 29.59 kg/m. If this is out of the aformentioned range listed, please consider follow up with your Primary Care Provider.   ________________________________________________________  The [facility] providers would like to encourage you to use MYCHART to communicate with providers for non-urgent requests or questions.  Due to long hold times on the telephone, sending your provider a message by [facility] may be a faster and more efficient way to get a response.  Please allow 48 business hours for a response.  Please remember that this is for non-urgent requests.  _______________________________________________________  [facility] is using a team-based approach to care.  Your team is made up of your doctor and two to three APPS. Our APPS (Nurse Practitioners and Physician Assistants) work with your physician to ensure care continuity for you. They are fully qualified to address your health concerns and develop a treatment plan. They communicate directly with your gastroenterologist to care for you. Seeing the Advanced Practice Practitioners on your physician's team can help you by facilitating care more promptly, often allowing for earlier appointments, access to diagnostic testing,  procedures, and other specialty referrals.
